# Patient Record
Sex: Female | Born: 1955
Health system: Southern US, Community
[De-identification: ages and names within clinical notes are randomized; demographics above are authoritative.]

## PROBLEM LIST (undated history)

## (undated) DIAGNOSIS — R002 Palpitations: Secondary | ICD-10-CM

## (undated) DIAGNOSIS — K5090 Crohn's disease, unspecified, without complications: Secondary | ICD-10-CM

## (undated) HISTORY — DX: Crohn's disease, unspecified, without complications: K50.90

## (undated) HISTORY — DX: Palpitations: R00.2

---

## 2005-05-21 ENCOUNTER — Ambulatory Visit: Payer: Self-pay | Admitting: Family Medicine

## 2005-06-11 ENCOUNTER — Ambulatory Visit: Payer: Self-pay | Admitting: Family Medicine

## 2005-07-09 ENCOUNTER — Ambulatory Visit: Payer: Self-pay | Admitting: Family Medicine

## 2005-07-23 ENCOUNTER — Ambulatory Visit: Payer: Self-pay | Admitting: Family Medicine

## 2005-11-28 ENCOUNTER — Ambulatory Visit: Payer: Self-pay | Admitting: Family Medicine

## 2006-01-29 DIAGNOSIS — I10 Essential (primary) hypertension: Secondary | ICD-10-CM | POA: Insufficient documentation

## 2006-01-29 DIAGNOSIS — E0590 Thyrotoxicosis, unspecified without thyrotoxic crisis or storm: Secondary | ICD-10-CM | POA: Insufficient documentation

## 2006-01-29 DIAGNOSIS — D509 Iron deficiency anemia, unspecified: Secondary | ICD-10-CM | POA: Insufficient documentation

## 2006-01-29 DIAGNOSIS — R002 Palpitations: Secondary | ICD-10-CM | POA: Insufficient documentation

## 2006-01-29 DIAGNOSIS — M479 Spondylosis, unspecified: Secondary | ICD-10-CM | POA: Insufficient documentation

## 2006-01-29 DIAGNOSIS — L2089 Other atopic dermatitis: Secondary | ICD-10-CM | POA: Insufficient documentation

## 2006-01-29 HISTORY — DX: Palpitations: R00.2

## 2006-03-25 ENCOUNTER — Encounter: Payer: Self-pay | Admitting: Family Medicine

## 2006-03-29 ENCOUNTER — Ambulatory Visit: Payer: Self-pay | Admitting: Family Medicine

## 2006-03-29 LAB — CONVERTED CEMR LAB: Sed Rate: 34 mm/hr — ABNORMAL HIGH (ref 0–22)

## 2006-04-01 ENCOUNTER — Telehealth (INDEPENDENT_AMBULATORY_CARE_PROVIDER_SITE_OTHER): Payer: Self-pay | Admitting: *Deleted

## 2006-04-01 ENCOUNTER — Telehealth: Payer: Self-pay | Admitting: Family Medicine

## 2006-04-02 ENCOUNTER — Telehealth (INDEPENDENT_AMBULATORY_CARE_PROVIDER_SITE_OTHER): Payer: Self-pay | Admitting: *Deleted

## 2006-04-02 ENCOUNTER — Telehealth: Payer: Self-pay | Admitting: Family Medicine

## 2006-04-30 ENCOUNTER — Telehealth: Payer: Self-pay | Admitting: Family Medicine

## 2006-04-30 ENCOUNTER — Telehealth (INDEPENDENT_AMBULATORY_CARE_PROVIDER_SITE_OTHER): Payer: Self-pay | Admitting: *Deleted

## 2006-05-02 ENCOUNTER — Ambulatory Visit: Payer: Self-pay | Admitting: Family Medicine

## 2006-06-04 ENCOUNTER — Encounter: Payer: Self-pay | Admitting: Family Medicine

## 2006-06-04 LAB — CONVERTED CEMR LAB: Sed Rate: 13 mm/hr (ref 0–22)

## 2006-06-07 ENCOUNTER — Ambulatory Visit: Payer: Self-pay | Admitting: Family Medicine

## 2006-06-07 DIAGNOSIS — N951 Menopausal and female climacteric states: Secondary | ICD-10-CM | POA: Insufficient documentation

## 2006-07-08 ENCOUNTER — Ambulatory Visit: Payer: Self-pay | Admitting: Family Medicine

## 2006-09-05 ENCOUNTER — Ambulatory Visit: Payer: Self-pay | Admitting: Family Medicine

## 2006-09-09 ENCOUNTER — Encounter: Payer: Self-pay | Admitting: Family Medicine

## 2006-12-02 ENCOUNTER — Ambulatory Visit: Payer: Self-pay | Admitting: Family Medicine

## 2006-12-09 ENCOUNTER — Telehealth: Payer: Self-pay | Admitting: Family Medicine

## 2006-12-09 DIAGNOSIS — M899 Disorder of bone, unspecified: Secondary | ICD-10-CM | POA: Insufficient documentation

## 2006-12-09 DIAGNOSIS — M949 Disorder of cartilage, unspecified: Secondary | ICD-10-CM

## 2006-12-12 ENCOUNTER — Encounter: Payer: Self-pay | Admitting: Family Medicine

## 2006-12-12 LAB — CONVERTED CEMR LAB
ALT: 15 units/L (ref 0–35)
Alkaline Phosphatase: 56 units/L (ref 39–117)
Cholesterol: 175 mg/dL (ref 0–200)
Creatinine, Ser: 0.98 mg/dL (ref 0.40–1.20)
LDL Cholesterol: 86 mg/dL (ref 0–99)
MCHC: 31.8 g/dL (ref 30.0–36.0)
MCV: 83 fL (ref 78.0–100.0)
Platelets: 275 10*3/uL (ref 150–400)
RDW: 13.2 % (ref 11.5–14.0)
Sed Rate: 15 mm/hr (ref 0–22)
Sodium: 142 meq/L (ref 135–145)
Total Bilirubin: 0.7 mg/dL (ref 0.3–1.2)
Total CHOL/HDL Ratio: 2.3
Total Protein: 6.4 g/dL (ref 6.0–8.3)
VLDL: 14 mg/dL (ref 0–40)
Vit D, 1,25-Dihydroxy: 29 (ref 20–57)

## 2006-12-13 ENCOUNTER — Telehealth (INDEPENDENT_AMBULATORY_CARE_PROVIDER_SITE_OTHER): Payer: Self-pay | Admitting: *Deleted

## 2006-12-13 ENCOUNTER — Encounter: Payer: Self-pay | Admitting: Family Medicine

## 2006-12-13 LAB — CONVERTED CEMR LAB: Free T4: 1.34 ng/dL (ref 0.89–1.80)

## 2006-12-16 ENCOUNTER — Telehealth: Payer: Self-pay | Admitting: Family Medicine

## 2006-12-16 ENCOUNTER — Encounter: Payer: Self-pay | Admitting: Family Medicine

## 2007-01-09 ENCOUNTER — Encounter: Payer: Self-pay | Admitting: Family Medicine

## 2007-01-28 ENCOUNTER — Encounter: Payer: Self-pay | Admitting: Family Medicine

## 2007-01-28 DIAGNOSIS — M459 Ankylosing spondylitis of unspecified sites in spine: Secondary | ICD-10-CM | POA: Insufficient documentation

## 2007-01-28 DIAGNOSIS — K5090 Crohn's disease, unspecified, without complications: Secondary | ICD-10-CM

## 2007-01-28 HISTORY — DX: Crohn's disease, unspecified, without complications: K50.90

## 2007-03-10 ENCOUNTER — Encounter: Payer: Self-pay | Admitting: Family Medicine

## 2007-03-17 ENCOUNTER — Ambulatory Visit: Payer: Self-pay | Admitting: Family Medicine

## 2007-03-17 DIAGNOSIS — M25469 Effusion, unspecified knee: Secondary | ICD-10-CM | POA: Insufficient documentation

## 2007-03-27 ENCOUNTER — Telehealth: Payer: Self-pay | Admitting: Family Medicine

## 2007-04-07 ENCOUNTER — Encounter: Payer: Self-pay | Admitting: Family Medicine

## 2007-04-07 LAB — CONVERTED CEMR LAB
ALT: 16 units/L
BUN: 16 mg/dL
CO2: 30 meq/L
Calcium: 10.5 mg/dL
GFR calc non Af Amer: >60 mL/min
Glucose, Bld: 97 mg/dL
Potassium: 3.8 meq/L
Sodium: 141 meq/L
Total Bilirubin: 1 mg/dL
Total Protein: 7.5 g/dL

## 2007-04-09 ENCOUNTER — Encounter: Payer: Self-pay | Admitting: Family Medicine

## 2007-04-23 ENCOUNTER — Encounter: Payer: Self-pay | Admitting: Family Medicine

## 2007-05-15 ENCOUNTER — Ambulatory Visit: Payer: Self-pay | Admitting: Family Medicine

## 2007-05-15 DIAGNOSIS — G4700 Insomnia, unspecified: Secondary | ICD-10-CM | POA: Insufficient documentation

## 2007-05-15 DIAGNOSIS — L608 Other nail disorders: Secondary | ICD-10-CM | POA: Insufficient documentation

## 2007-05-16 LAB — CONVERTED CEMR LAB
Free T4: 1.24 ng/dL (ref 0.89–1.80)
TSH: 0.229 microintl units/mL — ABNORMAL LOW (ref 0.350–5.50)

## 2007-05-28 ENCOUNTER — Encounter: Payer: Self-pay | Admitting: Family Medicine

## 2007-05-29 ENCOUNTER — Telehealth (INDEPENDENT_AMBULATORY_CARE_PROVIDER_SITE_OTHER): Payer: Self-pay | Admitting: *Deleted

## 2007-06-04 ENCOUNTER — Encounter: Payer: Self-pay | Admitting: Family Medicine

## 2007-06-05 ENCOUNTER — Encounter: Payer: Self-pay | Admitting: Family Medicine

## 2007-08-04 ENCOUNTER — Ambulatory Visit: Payer: Self-pay | Admitting: Family Medicine

## 2007-08-08 ENCOUNTER — Encounter: Payer: Self-pay | Admitting: Family Medicine

## 2007-10-02 ENCOUNTER — Ambulatory Visit: Payer: Self-pay | Admitting: Family Medicine

## 2007-10-02 DIAGNOSIS — K1321 Leukoplakia of oral mucosa, including tongue: Secondary | ICD-10-CM | POA: Insufficient documentation

## 2007-10-20 ENCOUNTER — Encounter: Payer: Self-pay | Admitting: Family Medicine

## 2007-11-20 ENCOUNTER — Ambulatory Visit: Payer: Self-pay | Admitting: Family Medicine

## 2008-01-23 ENCOUNTER — Encounter: Payer: Self-pay | Admitting: Family Medicine

## 2008-02-04 ENCOUNTER — Telehealth: Payer: Self-pay | Admitting: Family Medicine

## 2008-02-06 ENCOUNTER — Ambulatory Visit: Payer: Self-pay | Admitting: Family Medicine

## 2008-03-31 ENCOUNTER — Encounter: Payer: Self-pay | Admitting: Family Medicine

## 2008-04-22 ENCOUNTER — Encounter: Payer: Self-pay | Admitting: Family Medicine

## 2008-04-22 LAB — CONVERTED CEMR LAB
ALT: 25 units/L (ref 0–35)
AST: 31 units/L (ref 0–37)
Albumin: 4.3 g/dL (ref 3.5–5.2)
Alkaline Phosphatase: 60 units/L (ref 39–117)
LDL Cholesterol: 124 mg/dL — ABNORMAL HIGH (ref 0–99)
MCHC: 32 g/dL (ref 30.0–36.0)
MCV: 84.3 fL (ref 78.0–100.0)
Platelets: 217 10*3/uL (ref 150–400)
Potassium: 3.7 meq/L (ref 3.5–5.3)
Sodium: 142 meq/L (ref 135–145)
TSH: 0.536 microintl units/mL (ref 0.350–4.50)
Total Bilirubin: 0.7 mg/dL (ref 0.3–1.2)
Total Protein: 7 g/dL (ref 6.0–8.3)
VLDL: 10 mg/dL (ref 0–40)

## 2008-04-26 ENCOUNTER — Encounter: Payer: Self-pay | Admitting: Family Medicine

## 2008-07-02 ENCOUNTER — Encounter: Payer: Self-pay | Admitting: Family Medicine

## 2008-08-12 ENCOUNTER — Ambulatory Visit: Payer: Self-pay | Admitting: Family Medicine

## 2009-01-28 ENCOUNTER — Encounter: Payer: Self-pay | Admitting: Family Medicine

## 2009-02-21 ENCOUNTER — Ambulatory Visit: Payer: Self-pay | Admitting: Family Medicine

## 2009-04-21 ENCOUNTER — Encounter: Payer: Self-pay | Admitting: Family Medicine

## 2009-04-25 LAB — CONVERTED CEMR LAB
Albumin: 4.3 g/dL (ref 3.5–5.2)
Alkaline Phosphatase: 52 units/L (ref 39–117)
Calcium: 9.2 mg/dL (ref 8.4–10.5)
Chloride: 102 meq/L (ref 96–112)
Glucose, Bld: 79 mg/dL (ref 70–99)
Hemoglobin: 14.3 g/dL (ref 12.0–15.0)
LDL Cholesterol: 148 mg/dL — ABNORMAL HIGH (ref 0–99)
MCHC: 32.8 g/dL (ref 30.0–36.0)
Potassium: 3.6 meq/L (ref 3.5–5.3)
RBC: 5.08 M/uL (ref 3.87–5.11)
Sodium: 142 meq/L (ref 135–145)
Total Protein: 7.1 g/dL (ref 6.0–8.3)
Triglycerides: 52 mg/dL (ref <150)
WBC: 6.4 10*3/uL (ref 4.0–10.5)

## 2009-11-21 ENCOUNTER — Encounter: Payer: Self-pay | Admitting: Family Medicine

## 2010-03-09 ENCOUNTER — Encounter: Payer: Self-pay | Admitting: Family Medicine

## 2010-03-29 ENCOUNTER — Ambulatory Visit: Payer: Self-pay | Admitting: Family Medicine

## 2010-04-21 ENCOUNTER — Encounter: Payer: Self-pay | Admitting: Family Medicine

## 2010-04-25 ENCOUNTER — Encounter: Payer: Self-pay | Admitting: Family Medicine

## 2010-04-25 LAB — CONVERTED CEMR LAB
CO2: 26 meq/L (ref 19–32)
Calcium: 9.3 mg/dL (ref 8.4–10.5)
Creatinine, Ser: 1.01 mg/dL (ref 0.40–1.20)
Free T4: 1.12 ng/dL (ref 0.80–1.80)
HCT: 44 % (ref 36.0–46.0)
Iron: 75 ug/dL (ref 42–145)
MCHC: 31.6 g/dL (ref 30.0–36.0)
MCV: 88 fL (ref 78.0–100.0)
Platelets: 230 10*3/uL (ref 150–400)
RDW: 13.3 % (ref 11.5–15.5)
Sodium: 142 meq/L (ref 135–145)
T3, Free: 2.9 pg/mL (ref 2.3–4.2)
TSH: 0.267 microintl units/mL — ABNORMAL LOW (ref 0.350–4.500)
UIBC: 270 ug/dL
WBC: 6 10*3/uL (ref 4.0–10.5)

## 2010-05-23 NOTE — Miscellaneous (Signed)
Summary: mammogram normal  Clinical Lists Changes  Observations: Added new observation of MAMMRECACT: Screening mammogram in 1 year.    (11/17/2009 21:00) Added new observation of MAMMOGRAM: No specific mammographic evidence of malignancy.  No significant changes compared to previous study.  Assessment: BIRADS 1. Location: [facility].    (11/17/2009 21:00)      Mammogram  Procedure date:  11/17/2009  Findings:      No specific mammographic evidence of malignancy.  No significant changes compared to previous study.  Assessment: BIRADS 1. Location: [facility].     Comments:      Screening mammogram in 1 year.      Mammogram  Procedure date:  11/17/2009  Findings:      No specific mammographic evidence of malignancy.  No significant changes compared to previous study.  Assessment: BIRADS 1. Location: [facility].     Comments:      Screening mammogram in 1 year.

## 2010-05-23 NOTE — Assessment & Plan Note (Signed)
Summary: f/u BP   Vital Signs:  Patient profile:   55 year old female Height:      63.5 inches Weight:      147 pounds BMI:     25.72 O2 Sat:      100 % on Room air Pulse rate:   72 / minute BP sitting:   122 / 68  (left arm) Cuff size:   regular  Vitals Entered By: Payton Spark CMA (March 29, 2010 3:15 PM)  O2 Flow:  Room air CC: F/U meds. HCTZ refills, Hypertension Management   CC:  F/U meds. HCTZ refills and Hypertension Management.  Hypertension History:      She denies headache, chest pain, palpitations, dyspnea with exertion, orthopnea, peripheral edema, visual symptoms, neurologic problems, syncope, and side effects from treatment.  She notes no problems with any antihypertensive medication side effects.        Positive major cardiovascular risk factors include hypertension.  Negative major cardiovascular risk factors include female age less than 15 years old, no history of diabetes or hyperlipidemia, negative family history for ischemic heart disease, and non-tobacco-user status.        Further assessment for target organ damage reveals no history of ASHD, cardiac end-organ damage (CHF/LVH), stroke/TIA, peripheral vascular disease, renal insufficiency, or hypertensive retinopathy.     Current Medications (verified): 1)  Garlic 500 Mg  Tabs (Garlic) .... Take 1 Tablet By Mouth Once A Day 2)  Multivitamins  Caps (Multiple Vitamin) .... Once Daily By Mouth 3)  Prempro 0.45-1.5 Mg Tabs (Conj Estrog-Medroxyprogest Ace) .... Once Daily By Mouth 4)  Sulfasalazine 500 Mg  Tabs (Sulfasalazine) .... Take One Tab By Mouth Four Times Daily 5)  Caltrate 600+d 600-400 Mg-Unit  Tabs (Calcium Carbonate-Vitamin D) .... Take 1 Tablet By Mouth Two Times A Day 6)  Coq-10 30 Mg  Caps (Coenzyme Q10) .... Take 1 Tablet By Mouth Once A Day 7)  Fish Oil 1000 Mg  Caps (Omega-3 Fatty Acids) .... Take 1 Tablet By Mouth Three Times A Day 8)  Ra Glucosamine-Chondroit-Msm 500-400-300 Mg  Tabs  (Glucosamine-Chondroitin-Msm) .... Take 1 Tablet By Mouth Two Times A Day 9)  Magnesium Citrate 160mg  .... Take Two By Mouth Daily 10)  Iron 28mg  .... Take 1 Tablet By Mouth Once A Day 11)  C-500 500 Mg  Chew (Ascorbic Acid) .... Take 1 Tablet By Mouth Two Times A Day 12)  Wobenzyme Enzyme Supplement .... Take 3 By Mouth Two Times A Day 13)  Evening Primrose 500mg  .... Take Two By Mouth Three Times Daily 14)  Hydrochlorothiazide 25 Mg Tabs (Hydrochlorothiazide) .Marland Kitchen.. 1 Tab By Mouth Daily 15)  Humira 20 Mg/0.25ml Kit (Adalimumab) .... Use As Directed  Allergies (verified): No Known Drug Allergies  Past History:  Past Medical History: Reviewed history from 05/15/2007 and no changes required. ? MVP eczema  Retinal inflammation subclinical hyperthryoidism  anemia, microcytic - check iron studies spondyloarthropathy - Dr Christen Bame colonoscopy 9-08 suggestive of crohns dz (chronic active ileitis, focal chronic active colitis, focal chronic active proctitis)  seeing [facility]  Past Surgical History: Reviewed history from 01/29/2006 and no changes required. cspine xray L4-L5 Degen changes  Social History: Reviewed history from 08/12/2008 and no changes required. Airline pilot for [employer].  Married to [place] and has 3 kids, 22, 19, and 16yo.  Quit smoking in 2003.  Denies ETOH.  Does not exercise.  teaching at [employer]  Review of Systems      See  HPI  Physical Exam  General:  alert, well-developed, well-nourished, and well-hydrated.   Head:  normocephalic and atraumatic.   Eyes:  pupils equal, pupils round, and pupils reactive to light.   Mouth:  good dentition and pharynx pink and moist.   Neck:  supple and no masses.   Lungs:  Normal respiratory effort, chest expands symmetrically. Lungs are clear to auscultation, no crackles or wheezes. Heart:  Normal rate and regular rhythm. S1 and S2 normal without gallop, murmur, click, rub or other extra sounds. Pulses:  2+  radial pulses Extremities:  no LE edema Skin:  color normal.   Cervical Nodes:  No lymphadenopathy noted Psych:  good eye contact, not anxious appearing, and not depressed appearing.     Impression & Recommendations:  Problem # 1:  HYPERTENSION, BENIGN SYSTEMIC (ICD-401.1) BP looks great.  Continue HCTZ daily and update fasting labs in Jan.  Meds RFd. Her updated medication list for this problem includes:    Hydrochlorothiazide 25 Mg Tabs (Hydrochlorothiazide) .Marland Kitchen... 1 tab by mouth daily  Orders: T-Basic Metabolic Panel [id number])  BP today: 122/68 Prior BP: 122/68 (02/21/2009)  Prior 10 Yr Risk Heart Disease: 5 % (02/21/2009)  Labs Reviewed: K+: 3.6 (04/21/2009) Creat: : 0.97 (04/21/2009)   Chol: 255 (04/21/2009)   HDL: 97 (04/21/2009)   LDL: 148 (04/21/2009)   TG: 52 (04/21/2009)  Problem # 2:  SPONDYLOARTHROPATHY (ICD-721.90) Followed by Dr Christen Bame.  On Humira and doing well. Flu shot updated today. Labs ordered for Jan.  Complete Medication List: 1)  Garlic 500 Mg Tabs (Garlic) .... Take 1 tablet by mouth once a day 2)  Multivitamins Caps (Multiple vitamin) .... Once daily by mouth 3)  Prempro 0.45-1.5 Mg Tabs (Conj estrog-medroxyprogest ace) .... Once daily by mouth 4)  Sulfasalazine 500 Mg Tabs (Sulfasalazine) .... Take one tab by mouth four times daily 5)  Caltrate 600+d 600-400 Mg-unit Tabs (Calcium carbonate-vitamin d) .... Take 1 tablet by mouth two times a day 6)  Coq-10 30 Mg Caps (Coenzyme q10) .... Take 1 tablet by mouth once a day 7)  Fish Oil 1000 Mg Caps (Omega-3 fatty acids) .... Take 1 tablet by mouth three times a day 8)  Ra Glucosamine-chondroit-msm 500-400-300 Mg Tabs (Glucosamine-chondroitin-msm) .... Take 1 tablet by mouth two times a day 9)  Magnesium Citrate 160mg   .... Take two by mouth daily 10)  Iron 28mg   .... Take 1 tablet by mouth once a day 11)  C-500 500 Mg Chew (Ascorbic acid) .... Take 1 tablet by mouth two times a day 12)  Wobenzyme  Enzyme Supplement  .... Take 3 by mouth two times a day 13)  Evening Primrose 500mg   .... Take two by mouth three times daily 14)  Hydrochlorothiazide 25 Mg Tabs (Hydrochlorothiazide) .Marland Kitchen.. 1 tab by mouth daily 15)  Humira 20 Mg/0.69ml Kit (Adalimumab) .... Use as directed  Other Orders: T-CBC No Diff ([id number]) T-Vitamin D (25-Hydroxy) [phone]) T-Iron [id number]) T-Iron Binding Capacity (TIBC) ([address]) T-TSH [phone]) Flu Vaccine 88yrs + (40347) Admin 1st Vaccine (42595)  Hypertension Assessment/Plan:      The patient's hypertensive risk group is category A: No risk factors and no target organ damage.  Her calculated 10 year risk of coronary heart disease is 5 %.  Today's blood pressure is 122/68.  Her blood pressure goal is < 140/90.  Patient Instructions: 1)  Flu shot today. 2)  Update fasting labs downstairs in [place]. 3)  Will call you w/ results. 4)  HCTZ refilled for  a year thru [facility]. 5)  Call if any problems. 6)  Return for f/u in 1 yr, sooner if needed for anything! Prescriptions: HYDROCHLOROTHIAZIDE 25 MG TABS (HYDROCHLOROTHIAZIDE) 1 tab by mouth daily  #90 x 3   Entered and Authorized by:   Seymour Bars DO   Signed by:   Seymour Bars DO on 03/29/2010   Method used:   Electronically to        [facility]* (retail)             ,          Ph: [phone]       Fax: [phone]   RxID:   [id number]    Orders Added: 1)  T-CBC No Diff [[id number]] 2)  T-Basic Metabolic Panel [60737-10626] 3)  T-Vitamin D (25-Hydroxy) [94854-62703] 4)  T-Iron [[id number]] 5)  T-Iron Binding Capacity (TIBC) [[address]] 6)  T-TSH [93810-17510] 7)  Est. Patient Level III [25852] 8)  Flu Vaccine 10yrs + [77824] 9)  Admin 1st Vaccine [23536]   Immunizations Administered:  Influenza Vaccine # 1:    Vaccine Type: Fluvax 3+    Site: left deltoid    Mfr: GlaxoSmithKline    Dose: 0.5 ml    Route: IM    Given by: Sue Lush McCrimmon CMA, (AAMA)     Exp. Date: 09/22/2010    Lot #: [id number]    VIS given: 11/15/09 version given March 29, 2010.  Flu Vaccine Consent Questions:    Do you have a history of severe allergic reactions to this vaccine? no    Any prior history of allergic reactions to egg and/or gelatin? no    Do you have a sensitivity to the preservative Thimersol? no    Do you have a past history of Guillan-Barre Syndrome? no    Do you currently have an acute febrile illness? no    Have you ever had a severe reaction to latex? no    Vaccine information given and explained to patient? no    Are you currently pregnant? no   Immunizations Administered:  Influenza Vaccine # 1:    Vaccine Type: Fluvax 3+    Site: left deltoid    Mfr: GlaxoSmithKline    Dose: 0.5 ml    Route: IM    Given by: Sue Lush McCrimmon CMA, (AAMA)    Exp. Date: 09/22/2010    Lot #: [id number]    VIS given: 11/15/09 version given March 29, 2010.

## 2010-07-04 ENCOUNTER — Encounter: Payer: Self-pay | Admitting: Emergency Medicine

## 2010-07-04 ENCOUNTER — Ambulatory Visit (INDEPENDENT_AMBULATORY_CARE_PROVIDER_SITE_OTHER): Payer: BC Managed Care – PPO | Admitting: Emergency Medicine

## 2010-07-04 DIAGNOSIS — J069 Acute upper respiratory infection, unspecified: Secondary | ICD-10-CM

## 2010-07-05 ENCOUNTER — Encounter: Payer: Self-pay | Admitting: Emergency Medicine

## 2010-07-07 ENCOUNTER — Telehealth (INDEPENDENT_AMBULATORY_CARE_PROVIDER_SITE_OTHER): Payer: Self-pay | Admitting: *Deleted

## 2010-07-11 NOTE — Progress Notes (Signed)
  Phone Note Outgoing Call   Call placed by: Clemens Catholic LPN,  July 07, 2010 11:13 AM Call placed to: Patient Summary of Call: call back: pt states that she is feeling better. advised her of her throat culture results and to complete ABT. call back if she has any questions or concerns. Initial call taken by: Clemens Catholic LPN,  July 07, 2010 11:14 AM

## 2010-07-11 NOTE — Assessment & Plan Note (Signed)
Summary: EARACHE, SORE THROAT/TJ rm 5   Vital Signs:  Patient Profile:   55 Years Old Female CC:      ear ache, sore throat Height:     63.5 inches Weight:      148.25 pounds O2 Sat:      100 % O2 treatment:    Room Air Temp:     98.8 degrees F oral Pulse rate:   72 / minute Resp:     16 per minute BP sitting:   123 / 71  (left arm) Cuff size:   regular  Vitals Entered By: Clemens Catholic LPN (July 04, 2010 4:31 PM)                  Updated Prior Medication List: GARLIC 500 MG  TABS (GARLIC) Take 1 tablet by mouth once a day MULTIVITAMINS  CAPS (MULTIPLE VITAMIN) once daily by mouth PREMPRO 0.45-1.5 MG TABS (CONJ ESTROG-MEDROXYPROGEST ACE) once daily by mouth SULFASALAZINE 500 MG  TABS (SULFASALAZINE) take one tab by mouth four times daily CALTRATE 600+D 600-400 MG-UNIT  TABS (CALCIUM CARBONATE-VITAMIN D) Take 1 tablet by mouth two times a day COQ-10 30 MG  CAPS (COENZYME Q10) Take 1 tablet by mouth once a day FISH OIL 1000 MG  CAPS (OMEGA-3 FATTY ACIDS) Take 1 tablet by mouth three times a day RA GLUCOSAMINE-CHONDROIT-MSM 500-400-300 MG  TABS (GLUCOSAMINE-CHONDROITIN-MSM) Take 1 tablet by mouth two times a day * MAGNESIUM CITRATE 160MG  take two by mouth daily * IRON 28MG  Take 1 tablet by mouth once a day C-500 500 MG  CHEW (ASCORBIC ACID) Take 1 tablet by mouth two times a day * WOBENZYME ENZYME SUPPLEMENT take 3 by mouth two times a day * EVENING PRIMROSE 500MG  take two by mouth three times daily HYDROCHLOROTHIAZIDE 25 MG TABS (HYDROCHLOROTHIAZIDE) 1 tab by mouth daily HUMIRA 20 MG/0.4ML KIT (ADALIMUMAB) USE AS DIRECTED  Current Allergies (reviewed today): No known allergies History of Present Illness History from: patient Chief Complaint: ear ache, sore throat History of Present Illness: 55 Years Old Female complains of onset of cold symptoms for 4 days.  Joan Yang has been using Alavert which is helping a little bit. + sore throat + cough No pleuritic pain No  wheezing No nasal congestion No post-nasal drainage + sinus pain/pressure No chest congestion No itchy/red eyes + B earache No hemoptysis No SOB + chills/sweats No fever No nausea No vomiting No abdominal pain No diarrhea No skin rashes No fatigue No myalgias No headache   REVIEW OF SYSTEMS Constitutional Symptoms      Denies fever, chills, night sweats, weight loss, weight gain, and fatigue.  Eyes       Complains of glasses.      Denies change in vision, eye pain, eye discharge, contact lenses, and eye surgery. Ear/Nose/Throat/Mouth       Complains of ear pain, sore throat, and hoarseness.      Denies hearing loss/aids, change in hearing, ear discharge, dizziness, frequent runny nose, frequent nose bleeds, sinus problems, and tooth pain or bleeding.  Respiratory       Complains of dry cough.      Denies productive cough, wheezing, shortness of breath, asthma, bronchitis, and emphysema/COPD.  Cardiovascular       Denies murmurs, chest pain, and tires easily with exhertion.    Gastrointestinal       Denies stomach pain, nausea/vomiting, diarrhea, constipation, blood in bowel movements, and indigestion. Genitourniary       Denies painful urination,  kidney stones, and loss of urinary control. Neurological       Denies paralysis, seizures, and fainting/blackouts. Musculoskeletal       Denies muscle pain, joint pain, joint stiffness, decreased range of motion, redness, swelling, muscle weakness, and gout.  Skin       Denies bruising, unusual mles/lumps or sores, and hair/skin or nail changes.  Psych       Denies mood changes, temper/anger issues, anxiety/stress, speech problems, depression, and sleep problems. Other Comments: pt c/o bilateral ear ache, productive cough and sore throat x 1wk. no fever. she has taken OTC Alavert with no relief.   Past History:  Past Medical History: Reviewed history from 05/15/2007 and no changes required. ? MVP eczema  Retinal  inflammation subclinical hyperthryoidism  anemia, microcytic - check iron studies spondyloarthropathy - Dr Christen Bame colonoscopy 9-08 suggestive of crohns dz (chronic active ileitis, focal chronic active colitis, focal chronic active proctitis)  seeing [facility]  Past Surgical History: Reviewed history from 01/29/2006 and no changes required. cspine xray L4-L5 Degen changes  Family History: Reviewed history from 03/25/2006 and no changes required. 2 brothers healthy  father alive, HTN, OA Mother died of ESRD, HTN, RA  Social History: Reviewed history from 08/12/2008 and no changes required. 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  Quit smoking in 2003.  Denies ETOH.  Does not exercise.  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 Exam General appearance: well developed, well nourished, no acute distress Ears: L TM erythema, bilateral eustachian tube dysfx Nasal: mucosa pink, nonedematous, no septal deviation, turbinates normal Oral/Pharynx: clear drainage, mild erythema, tonsils mildly enlarged bilaterally, no exudates Chest/Lungs: no rales, wheezes, or rhonchi bilateral, breath sounds equal without effort Heart: regular rate and  rhythm, no murmur MSE: oriented to time, place, and person Assessment New Problems: UPPER RESPIRATORY INFECTION, ACUTE (ICD-465.9)   Plan New Medications/Changes: AMOXICILLIN 875 MG TABS (AMOXICILLIN) 1 by mouth two times a day for 10 days  #20 x 0, 07/04/2010, Hoyt Koch MD  New Orders: New Patient Level III [phone] Rapid Strep [87880] T-Culture, Throat [[id number]] Planning Comments:   1)  Take the prescribed antibiotic as instructed. 2)  Use nasal saline solution (over the counter) at least 3 times a day. 3)  Use over the counter decongestants like Zyrtec-D every 12 hours as needed to help with congestion.  Monitor BP.  Stop if BP >140/90 4)  Can take tylenol every 6 hours or motrin every 8  hours for pain or fever. 5)  Follow up with your primary doctor  if no improvement in 5-7 days, sooner if increasing pain, fever, or new symptoms.    The patient and/or caregiver has been counseled thoroughly with regard to medications prescribed including dosage, schedule, interactions, rationale for use, and possible side effects and they verbalize understanding.  Diagnoses and expected course of recovery discussed and will return if not improved as expected or if the condition worsens. Patient and/or caregiver verbalized understanding.  Prescriptions: AMOXICILLIN 875 MG TABS (AMOXICILLIN) 1 by mouth two times a day for 10 days  #20 x 0   Entered and Authorized by:   Hoyt Koch MD   Signed by:   Hoyt Koch MD on 07/04/2010   Method used:   Print then Give to Patient   RxID:   [id number]   Orders Added: 1)  New Patient Level III [29528] 2)  Rapid Strep [41324] 3)  T-Culture, Throat [[id number]]  Laboratory Results  Date/Time Received: July 04, 2010 4:46 PM  Date/Time Reported: July 04, 2010 4:46 PM   Other Tests  Rapid Strep: negative  Kit Test Internal QC: Negative   (Normal Range: Negative)

## 2010-10-19 ENCOUNTER — Telehealth: Payer: Self-pay | Admitting: Family Medicine

## 2010-10-19 NOTE — Telephone Encounter (Signed)
Pt calling and said she had seen her rheumatologist and that provider was getting labs and pt wants [place] to give her an order for the labs we need drawn from our office so she can combine the lab visit and do all at one time. Plan:  [facility] for the pt to call on [place] to let triage nurse what labs she feels she is suppose to have drawn from our office. Jarvis Newcomer, LPN [facility]

## 2010-10-20 NOTE — Telephone Encounter (Signed)
Request was taken care of and a message left for the patient that our orders had been sent down to [facility]. Joan Newcomer, LPN [facility]

## 2011-03-20 ENCOUNTER — Encounter: Payer: Self-pay | Admitting: Family Medicine

## 2011-03-20 ENCOUNTER — Ambulatory Visit (INDEPENDENT_AMBULATORY_CARE_PROVIDER_SITE_OTHER): Payer: BC Managed Care – PPO | Admitting: Family Medicine

## 2011-03-20 VITALS — BP 128/72 | HR 81 | Ht 64.0 in | Wt 150.0 lb

## 2011-03-20 DIAGNOSIS — I10 Essential (primary) hypertension: Secondary | ICD-10-CM

## 2011-03-20 DIAGNOSIS — M949 Disorder of cartilage, unspecified: Secondary | ICD-10-CM

## 2011-03-20 DIAGNOSIS — Z23 Encounter for immunization: Secondary | ICD-10-CM

## 2011-03-20 DIAGNOSIS — E0590 Thyrotoxicosis, unspecified without thyrotoxic crisis or storm: Secondary | ICD-10-CM

## 2011-03-20 DIAGNOSIS — Z0000 Encounter for general adult medical examination without abnormal findings: Secondary | ICD-10-CM

## 2011-03-20 DIAGNOSIS — M459 Ankylosing spondylitis of unspecified sites in spine: Secondary | ICD-10-CM

## 2011-03-20 DIAGNOSIS — M899 Disorder of bone, unspecified: Secondary | ICD-10-CM

## 2011-03-20 DIAGNOSIS — M8580 Other specified disorders of bone density and structure, unspecified site: Secondary | ICD-10-CM

## 2011-03-20 MED ORDER — HYDROCHLOROTHIAZIDE 25 MG PO TABS: 25.0000 mg | ORAL_TABLET | Freq: Every day | ORAL | Status: DC

## 2011-03-20 NOTE — Patient Instructions (Signed)

## 2011-03-20 NOTE — Progress Notes (Signed)
  Subjective:    Patient ID: Joan Yang, female    DOB: [birth year], 55 y.o.   MRN: [id number] #1 Hypertension  #2 ankylosis spondylolisthesis patient has been seeing a rheumatologist for this diagnosis. Apparently this has been cause of multiple medical problems as she's had some we discussed the need to go through her problem list ankle make some corrections. #3 need for immunization health maintenance review several issues it appeared as far as needed for 4 health maintenance i.e. immunization lab work and will discuss and review her records to evaluate what needs to be done. #4 Borderline hypothyroidism no new labs been done as far asthat problem #5 history of osteopenias she requests a bone densitometry    Review of Systems  All other systems reviewed and are negative.   medical records were reviewed and problem list scrutinized and modify. Also vaccinations and health maintenance issues were discussed patient to make sure they were all up to date also discussed was the need for certain labs in the very near future. Flu vaccination was also discussed    BP 128/72  Pulse 81  Ht 5\' 4"  (1.626 m)  Wt 150 lb (68.04 kg)  BMI 25.75 kg/m2  SpO2 100% Objective:   Physical Exam  Constitutional: She is oriented to person, place, and time. She appears well-developed and well-nourished.  HENT:  Head: Normocephalic and atraumatic.  Eyes: Pupils are equal, round, and reactive to light.  Neck: Normal range of motion. Neck supple.  Cardiovascular: Normal rate, regular rhythm and normal heart sounds.   Pulmonary/Chest: Effort normal and breath sounds normal.  Musculoskeletal: Normal range of motion.  Neurological: She is alert and oriented to person, place, and time.  Skin: Skin is warm and dry.  Psychiatric: She has a normal mood and affect. Her behavior is normal.     assessment and plans   #1 hypertension we'll we'll continue to treat with HCTZ 25 mg #2 5#ankylosis spondylitis he says  renal panel will be checked with the CPAP as well as ALT and AST and give patient opportunity to share with her rheumatologist. #3 flu shot will be given resident physician is. The current also most of the maintenance seizures have also been done we'll check SMAC or CMP and CBC #4 hypothyroidism we'll check a TSH level #5 osteopenia we will check a bone densitometry and a T3 level      Assesment and plans #1 hypertension hypertension hypertension hypertension high per tension hypertension will continue with the HCTZ 25 mg hypertension we'll continue to treat with HCTZ 25 mg hypertension   #1

## 2011-04-20 LAB — COMPLETE METABOLIC PANEL WITH GFR
ALT: 19 U/L (ref 0–35)
AST: 33 U/L (ref 0–37)
CO2: 28 mEq/L (ref 19–32)
Chloride: 103 mEq/L (ref 96–112)
Creat: 0.94 mg/dL (ref 0.50–1.10)
GFR, Est African American: 79 mL/min
Sodium: 141 mEq/L (ref 135–145)
Total Bilirubin: 0.6 mg/dL (ref 0.3–1.2)
Total Protein: 6.9 g/dL (ref 6.0–8.3)

## 2011-04-20 LAB — LIPID PANEL
Cholesterol: 243 mg/dL — ABNORMAL HIGH (ref 0–200)
LDL Cholesterol: 141 mg/dL — ABNORMAL HIGH (ref 0–99)
Total CHOL/HDL Ratio: 2.6 Ratio
Triglycerides: 40 mg/dL (ref <150)

## 2011-04-20 LAB — HEMOGLOBIN A1C: Mean Plasma Glucose: 108 mg/dL (ref <117)

## 2011-04-20 LAB — TSH: TSH: 0.156 u[IU]/mL — ABNORMAL LOW (ref 0.350–4.500)

## 2011-05-01 ENCOUNTER — Other Ambulatory Visit: Payer: BC Managed Care – PPO

## 2011-05-18 ENCOUNTER — Encounter: Payer: Self-pay | Admitting: Physician Assistant

## 2011-05-18 ENCOUNTER — Ambulatory Visit (INDEPENDENT_AMBULATORY_CARE_PROVIDER_SITE_OTHER): Payer: BC Managed Care – PPO | Admitting: Physician Assistant

## 2011-05-18 VITALS — BP 128/73 | HR 65 | Temp 98.3°F | Ht 65.0 in | Wt 147.0 lb

## 2011-05-18 DIAGNOSIS — I10 Essential (primary) hypertension: Secondary | ICD-10-CM

## 2011-05-18 DIAGNOSIS — R079 Chest pain, unspecified: Secondary | ICD-10-CM

## 2011-05-18 LAB — CK TOTAL AND CKMB (NOT AT ARMC)
CK, MB: 2.1 ng/mL (ref 0.3–4.0)
Relative Index: 1 (ref 0.0–2.5)

## 2011-05-18 LAB — TROPONIN I: Troponin I: <0.04 ng/mL (ref <0.06)

## 2011-05-18 MED ORDER — HYDROCHLOROTHIAZIDE 25 MG PO TABS: 25.0000 mg | ORAL_TABLET | Freq: Every day | ORAL | Status: DC

## 2011-05-18 NOTE — Progress Notes (Signed)
  Subjective:    Patient ID: Joan Yang, female    DOB: [birth year], 56 y.o.   MRN: [id number]  HPI Patient presents to the clinic this morning complaining of chest pain and belching. She's been on a [employer] fast for 21 days. She's not been eating meat at all and nothing to keep him she states and Thursday. She started having chest pain about a week ago. The chest pain is at rest only and not with exertion. She has experienced this pain 3 times since Friday. She continues to workout without chest pain. The pain has radiated into her left shoulder and down into her abdomen. She has had a history of indigestion and abdominal pain but she reports that she has never had chest pain. She has had a history of anemia but no longer takes iron. She has been belching a lot and noticed more flatulence. She denies bowel changes with diarrhea or constipation. Her heart does feel like a fluttering sometimes this is an ongoing problem.  She needs refills on her HCTZ. She denies any problems with her blood pressure.   Review of Systems     Objective:   Physical Exam  Constitutional: She is oriented to person, place, and time. She appears well-developed and well-nourished.  HENT:  Head: Normocephalic and atraumatic.  Right Ear: External ear normal.  Left Ear: External ear normal.  Cardiovascular: Regular rhythm, normal heart sounds and intact distal pulses.        Sinus Bradycardia at 65 on ECG.  Pulmonary/Chest: Effort normal and breath sounds normal.  Abdominal: Soft. Bowel sounds are normal. She exhibits no distension. There is no tenderness.  Neurological: She is alert and oriented to person, place, and time.  Skin: Skin is warm and dry.  Psychiatric: She has a normal mood and affect. Her behavior is normal.          Assessment & Plan:  Chest pain at rest-Hemoglobin 13.8. EKG-sinus bradycardia within normal. No ST segment changes. Labs drawn for troponin 1 and CK-MB. We'll call with results. Start  on omeprazole 20 mg daily.  Hypertension-refilled HCTZ.

## 2011-05-18 NOTE — Patient Instructions (Signed)
Omeprazole 20mg  daily. Get lab work. We will call with results.

## 2011-05-21 ENCOUNTER — Encounter: Payer: Self-pay | Admitting: *Deleted

## 2011-07-02 ENCOUNTER — Encounter: Payer: Self-pay | Admitting: Family Medicine

## 2011-07-02 ENCOUNTER — Ambulatory Visit (INDEPENDENT_AMBULATORY_CARE_PROVIDER_SITE_OTHER): Payer: BC Managed Care – PPO | Admitting: Family Medicine

## 2011-07-02 VITALS — BP 97/59 | HR 85 | Temp 98.3°F | Wt 146.0 lb

## 2011-07-02 DIAGNOSIS — J069 Acute upper respiratory infection, unspecified: Secondary | ICD-10-CM

## 2011-07-02 MED ORDER — MAGIC MOUTHWASH: 5.0000 mL | Freq: Three times a day (TID) | ORAL | Status: DC

## 2011-07-02 MED ORDER — AZITHROMYCIN 250 MG PO TABS: ORAL_TABLET | ORAL | Status: AC

## 2011-07-02 NOTE — Patient Instructions (Signed)

## 2011-07-02 NOTE — Progress Notes (Signed)
  Subjective:    Patient ID: Joan Yang, female    DOB: [birth year], 56 y.o.   MRN: [id number]  HPI 5 days of ST, otalgia bilat and wet cough. No fever.  Ears feel clogged. Taking  IBU, sudafed - helps some. No facial pressure. No ear dainage.  No SOB.  No hx of asthma. + sick contacts. Says she feels tired and sore all over. She had had dec appetite. SHe is also on humira. Says took her shot this AM. Trtied to hold off but her back was hurting from her sponyloarthropathy.    Review of Systems     Objective:   Physical Exam  Constitutional: She is oriented to person, place, and time. She appears well-developed and well-nourished.  HENT:  Head: Normocephalic and atraumatic.  Right Ear: External ear normal.  Left Ear: External ear normal.  Nose: Nose normal.  Mouth/Throat: Oropharynx is clear and moist.       TMs and canals are clear. She is a brown pigmentation of her tongue.  Eyes: Conjunctivae and EOM are normal. Pupils are equal, round, and reactive to light.  Neck: Neck supple. No thyromegaly present.  Cardiovascular: Normal rate, regular rhythm and normal heart sounds.   Pulmonary/Chest: Effort normal and breath sounds normal. She has no wheezes.  Lymphadenopathy:    She has no cervical adenopathy.  Neurological: She is alert and oriented to person, place, and time.  Skin: Skin is warm and dry.       Small mild ulcers on the sides of her tongue.   Psychiatric: She has a normal mood and affect.          Assessment & Plan:  URI - we discussed that she likely has a viral cold. She's only had symptoms for a few days. Does she is on Humira which is an immunosuppressant. I will plan: Prescription for azithromycin to the pharmacy. As such I get a couple more days 2 she starts to feel better. If not she can go ahead and fill it. If her ear pain persists in place followup. Her ear exam is actually normal today even though that is her main complaint. Continue symptomatic treatment  since it does give her some relief with the ibuprofen and Sudafed. I also wrote for Magic mouthwash for the ulcers on the side of her tongue.  Mouth ulcers.-  I also wrote for Magic mouthwash for the ulcers on the side of her tongue.

## 2011-07-03 ENCOUNTER — Telehealth: Payer: Self-pay | Admitting: *Deleted

## 2011-07-03 NOTE — Telephone Encounter (Signed)
Pt.notified

## 2011-07-03 NOTE — Telephone Encounter (Signed)
I will give you a copy of recipe to call into pharm.

## 2011-07-03 NOTE — Telephone Encounter (Signed)
Pt called and states pharm needs exact quantity and the instrucition and exactly what is in the magic mouth wash rx'ed. They have not filled it yet. Pharm also called and states the same thing

## 2011-11-05 ENCOUNTER — Other Ambulatory Visit: Payer: Self-pay | Admitting: Family Medicine

## 2011-11-05 DIAGNOSIS — Z1231 Encounter for screening mammogram for malignant neoplasm of breast: Secondary | ICD-10-CM

## 2011-12-11 ENCOUNTER — Ambulatory Visit (INDEPENDENT_AMBULATORY_CARE_PROVIDER_SITE_OTHER): Payer: BC Managed Care – PPO

## 2011-12-11 DIAGNOSIS — E0590 Thyrotoxicosis, unspecified without thyrotoxic crisis or storm: Secondary | ICD-10-CM

## 2011-12-11 DIAGNOSIS — M8580 Other specified disorders of bone density and structure, unspecified site: Secondary | ICD-10-CM

## 2011-12-11 DIAGNOSIS — Z1231 Encounter for screening mammogram for malignant neoplasm of breast: Secondary | ICD-10-CM

## 2011-12-11 DIAGNOSIS — Z13820 Encounter for screening for osteoporosis: Secondary | ICD-10-CM

## 2011-12-11 DIAGNOSIS — Z780 Asymptomatic menopausal state: Secondary | ICD-10-CM

## 2011-12-21 ENCOUNTER — Ambulatory Visit (INDEPENDENT_AMBULATORY_CARE_PROVIDER_SITE_OTHER): Payer: BC Managed Care – PPO | Admitting: Family Medicine

## 2011-12-21 ENCOUNTER — Encounter: Payer: Self-pay | Admitting: Family Medicine

## 2011-12-21 VITALS — BP 129/65 | HR 73 | Wt 158.0 lb

## 2011-12-21 DIAGNOSIS — R51 Headache: Secondary | ICD-10-CM

## 2011-12-21 DIAGNOSIS — R635 Abnormal weight gain: Secondary | ICD-10-CM

## 2011-12-21 DIAGNOSIS — R5381 Other malaise: Secondary | ICD-10-CM

## 2011-12-21 DIAGNOSIS — R5383 Other fatigue: Secondary | ICD-10-CM

## 2011-12-21 NOTE — Progress Notes (Signed)
Subjective:    Patient ID: Joan Yang, female    DOB: 1955-11-22, 56 y.o.   MRN: [id number]  HPI Has gained 15 lb in 6 months. Has been working out regularly.  Feels tired all the time.  Says she eats very healthy. She is on humira for her ankylosis spondylitis.  More requent HA thtn usual over the last 2-3 months.  No skin or hair changes.  Hx of eczema. She denies any pain or swelling in her neck. She says years ago she was actually hypothyroid was on medication. At one point in time she became hyperthyroid and I recommended surgical removal of her thyroid. She never had surgery and since then they decided to monitor her levels. She's always been a little bit borderline hyperthyroid. She does get frequent CBCs with your Humira. No prior history of anemia or B12 deficiency.   Review of Systems \ BP 129/65  Pulse 73  Wt 158 lb (71.668 kg)    No Known Allergies  No past medical history on file.  No past surgical history on file.  History   Social History  . Marital Status: Married    Spouse Name: N/A    Number of Children: N/A  . Years of Education: N/A   Occupational History  . Not on file.   Social History Main Topics  . Smoking status: Former Games developer  . Smokeless tobacco: Not on file  . Alcohol Use: Not on file  . Drug Use: Not on file  . Sexually Active: Not on file   Other Topics Concern  . Not on file   Social History Narrative  . No narrative on file    No family history on file.  Outpatient Encounter Prescriptions as of 12/21/2011  Medication Sig Dispense Refill  . adalimumab (HUMIRA PEN) 40 MG/0.8ML injection Inject 40 mg into the skin every 14 (fourteen) days.        . Alum & Mag Hydroxide-Simeth (MAGIC MOUTHWASH) SOLN Take 5 mLs by mouth 3 (three) times daily.  150 mL  0  . estrogen, conjugated,-medroxyprogesterone (PREMPRO) 0.45-1.5 MG per tablet Take 1 tablet by mouth daily.        . hydrochlorothiazide (HYDRODIURIL) 25 MG tablet Take 1 tablet (25 mg  total) by mouth daily.  90 tablet  3          Objective:   Physical Exam  Constitutional: She is oriented to person, place, and time. She appears well-developed and well-nourished.  HENT:  Head: Normocephalic and atraumatic.  Neck: Neck supple. No thyromegaly present.  Cardiovascular: Normal rate, regular rhythm and normal heart sounds.   Pulmonary/Chest: Effort normal and breath sounds normal.  Lymphadenopathy:    She has no cervical adenopathy.  Neurological: She is alert and oriented to person, place, and time.  Skin: Skin is warm and dry.  Psychiatric: She has a normal mood and affect. Her behavior is normal.          Assessment & Plan:  Abnormal weight gain- discussed that we will check her thyroid levels along with a free T4 and free T3. We'll also check for B12 deficiency and anemia since she also complains of fatigue today. We'll repeat a CBC as well as check electrolytes and also check renal function and liver function. She seems to be eating healthy overall and works out very regularly so I do agree that her weight gain seems to be pretty substantial pressure. Time for someone who has healthy lifestyle.  Fatigue-please see  above.

## 2011-12-21 NOTE — Patient Instructions (Addendum)
We will call you with your lab results. If you don't here from us in about a week then please give us a call at [phone].  

## 2011-12-22 LAB — CBC WITH DIFFERENTIAL/PLATELET
Eosinophils Absolute: 0.2 10*3/uL (ref 0.0–0.7)
Eosinophils Relative: 2 % (ref 0–5)
HCT: 41.8 % (ref 36.0–46.0)
Hemoglobin: 14 g/dL (ref 12.0–15.0)
Lymphs Abs: 2.4 10*3/uL (ref 0.7–4.0)
MCH: 27.1 pg (ref 26.0–34.0)
MCV: 81 fL (ref 78.0–100.0)
Monocytes Relative: 11 % (ref 3–12)
RBC: 5.16 MIL/uL — ABNORMAL HIGH (ref 3.87–5.11)

## 2011-12-22 LAB — COMPLETE METABOLIC PANEL WITH GFR
CO2: 29 mEq/L (ref 19–32)
Calcium: 9 mg/dL (ref 8.4–10.5)
Chloride: 104 mEq/L (ref 96–112)
Creat: 1.05 mg/dL (ref 0.50–1.10)
GFR, Est African American: 69 mL/min
GFR, Est Non African American: 60 mL/min
Glucose, Bld: 79 mg/dL (ref 70–99)
Total Bilirubin: 0.6 mg/dL (ref 0.3–1.2)
Total Protein: 6.8 g/dL (ref 6.0–8.3)

## 2011-12-25 ENCOUNTER — Other Ambulatory Visit: Payer: Self-pay | Admitting: Family Medicine

## 2011-12-25 DIAGNOSIS — R7989 Other specified abnormal findings of blood chemistry: Secondary | ICD-10-CM

## 2012-02-04 ENCOUNTER — Ambulatory Visit (INDEPENDENT_AMBULATORY_CARE_PROVIDER_SITE_OTHER): Payer: BC Managed Care – PPO

## 2012-02-04 DIAGNOSIS — R7989 Other specified abnormal findings of blood chemistry: Secondary | ICD-10-CM

## 2012-02-04 DIAGNOSIS — E041 Nontoxic single thyroid nodule: Secondary | ICD-10-CM

## 2012-06-13 ENCOUNTER — Encounter: Payer: Self-pay | Admitting: Sports Medicine

## 2012-06-13 ENCOUNTER — Ambulatory Visit (INDEPENDENT_AMBULATORY_CARE_PROVIDER_SITE_OTHER): Payer: BC Managed Care – PPO | Admitting: Sports Medicine

## 2012-06-13 VITALS — BP 116/66 | HR 66 | Temp 97.7°F | Wt 155.0 lb

## 2012-06-13 DIAGNOSIS — J020 Streptococcal pharyngitis: Secondary | ICD-10-CM

## 2012-06-13 DIAGNOSIS — J029 Acute pharyngitis, unspecified: Secondary | ICD-10-CM

## 2012-06-13 DIAGNOSIS — E042 Nontoxic multinodular goiter: Secondary | ICD-10-CM

## 2012-06-13 LAB — TSH: TSH: 0.145 u[IU]/mL — ABNORMAL LOW (ref 0.350–4.500)

## 2012-06-13 LAB — T3, FREE: T3, Free: 3.3 pg/mL (ref 2.3–4.2)

## 2012-06-13 LAB — POCT RAPID STREP A (OFFICE): Rapid Strep A Screen: NEGATIVE

## 2012-06-13 LAB — T4, FREE: Free T4: 1.3 ng/dL (ref 0.80–1.80)

## 2012-06-13 MED ORDER — MELOXICAM 15 MG PO TABS: ORAL_TABLET | ORAL | Status: DC

## 2012-06-13 NOTE — Assessment & Plan Note (Signed)
Rapid strep test is negative. Treatment will be symptomatic. I am going to call in some Mobic.

## 2012-06-13 NOTE — Assessment & Plan Note (Signed)
Does not quite meet criteria yet for Plummer Syndrome. She does endorse some palpitations. Recent TSHs have been low but T3 and T4 have been normal. I am going to recheck today. She should follow this up with her primary care physician.

## 2012-06-13 NOTE — Progress Notes (Signed)
Subjective:    CC: Sick  HPI: Joan Yang is a very pleasant 57 year old female with a history of multinodular goiter comes in with a several-day history of sore throat and ear fullness. She denies any sinus pain, pressure, nasal discharge, cough, rash, GI symptoms. She does note some palpitations. Symptoms are moderate, worsening.  Past medical history, Surgical history, Family history not pertinant except as noted below, Social history, Allergies, and medications have been entered into the medical record, reviewed, and no changes needed.   Review of Systems: No fevers, chills, night sweats, weight loss, chest pain, or shortness of breath.   Objective:    General: Well Developed, well nourished, and in no acute distress.  Neuro: Alert and oriented x3, extra-ocular muscles intact, sensation grossly intact.  HEENT: Normocephalic, atraumatic, pupils equal round reactive to light, neck supple, no masses, no lymphadenopathy, thyroid nonpalpable. Mildly erythematous oropharynx, nasopharynx and extremity her canals are unremarkable. Thyroid is palpable, enlarged, nontender without any discrete palpable nodules. Skin: Warm and dry, no rashes. Cardiac: Regular rate and rhythm, no murmurs rubs or gallops.  Respiratory: Clear to auscultation bilaterally. Not using accessory muscles, speaking in full sentences. Impression and Recommendations:

## 2012-08-04 ENCOUNTER — Encounter: Payer: Self-pay | Admitting: Family Medicine

## 2012-08-04 ENCOUNTER — Ambulatory Visit (INDEPENDENT_AMBULATORY_CARE_PROVIDER_SITE_OTHER): Payer: BC Managed Care – PPO | Admitting: Family Medicine

## 2012-08-04 VITALS — BP 106/58 | HR 59 | Ht 65.0 in | Wt 156.0 lb

## 2012-08-04 DIAGNOSIS — R002 Palpitations: Secondary | ICD-10-CM

## 2012-08-04 NOTE — Progress Notes (Signed)
CC: Joan Yang is a 57 y.o. female is here for Palpitations   Subjective: HPI:  Patient complains of palpitations that have been occurring for the past month. They're present on a daily basis. There are only noticeable during periods of rest and most noticeable when trying to fall sleep at night. Nothing in particular has influenced them for better or worse. She has had this in the past and they were result with propranolol but she is no longer taking this. She describes to palpations as a irregular rhythm mild to moderate in severity. During the events she denies any discomfort or sensations out of the ordinary for her.  She denies changing any medications but did recently start GARCINIA for weight loss., She's not sure if the palpitations started around the time she started this weight loss medication. She denies chest pain, shortness of breath, orthopnea, peripheral edema, headache, lightheadedness, confusion, rapid heartbeat, motor sensory disturbances, unintentional weight loss. She drinks one to 2 servings of caffeine 2-3 times a week, symptoms have not coincided with this intake.  Past medical history significant for below lower limit of normal TSH and anemia   Review Of Systems Outlined In HPI  Past Medical History  Diagnosis Date  . ankylosis spondylitis 01/28/2007    Qualifier: Diagnosis of  By: Thomos Lemons    . Palpitations 01/29/2006    Qualifier: Diagnosis of  By: Thomos Lemons       Family History  Problem Relation Age of Onset  . Rheum arthritis Mother      History  Substance Use Topics  . Smoking status: Former Games developer  . Smokeless tobacco: Not on file  . Alcohol Use: Not on file     Objective: Filed Vitals:   08/04/12 1505  BP: 106/58  Pulse: 59    General: Alert and Oriented, No Acute Distress HEENT: Pupils equal, round, reactive to light. Conjunctivae clear.  External ears unremarkable, canals clear with intact TMs with appropriate landmarks.  Middle  ear appears open without effusion. Pink inferior turbinates.  Moist mucous membranes, pharynx without inflammation nor lesions.  Neck supple without palpable lymphadenopathy nor abnormal masses. Lungs: Clear to auscultation bilaterally, no wheezing/ronchi/rales.  Comfortable work of breathing. Good air movement. Cardiac: Regular rate and rhythm. Normal S1/S2.  No murmurs, rubs, nor gallops.   Neuro: CN II-XII grossly intact, full strength/rom of all four extremities, gait normal  Extremities: No peripheral edema.  Strong peripheral pulses.  Mental Status: No depression, anxiety, nor agitation. Skin: Warm and dry.  Assessment & Plan: Joan Yang was seen today for palpitations.  Diagnoses and associated orders for this visit:  Palpitations - CBC - COMPLETE METABOLIC PANEL WITH GFR - Thyroid Panel With TSH    EKG obtained and interpreted by myself: Normal sinus rhythm at a rate of 64 beats per minute. Normal intervals, no pathologic Q waves, normal axis, no ST or segment elevation, no T wave abnormalities.  Palpitations: Will recheck thyroid panel, rule out anemia and electrolyte abnormalities. If labs above are normal we'll consider Holter monitoring plus or minus echocardiogram. Low suspicion for atrial fibrillation at this time.  Return if symptoms worsen or fail to improve.

## 2012-08-05 ENCOUNTER — Encounter: Payer: Self-pay | Admitting: *Deleted

## 2012-08-05 ENCOUNTER — Telehealth: Payer: Self-pay | Admitting: Family Medicine

## 2012-08-05 DIAGNOSIS — I10 Essential (primary) hypertension: Secondary | ICD-10-CM

## 2012-08-05 DIAGNOSIS — E876 Hypokalemia: Secondary | ICD-10-CM | POA: Insufficient documentation

## 2012-08-05 DIAGNOSIS — E0590 Thyrotoxicosis, unspecified without thyrotoxic crisis or storm: Secondary | ICD-10-CM

## 2012-08-05 LAB — THYROID PANEL WITH TSH
T3 Uptake: 31.2 % (ref 22.5–37.0)
TSH: 0.32 u[IU]/mL — ABNORMAL LOW (ref 0.350–4.500)

## 2012-08-05 LAB — COMPLETE METABOLIC PANEL WITH GFR
ALT: 17 U/L (ref 0–35)
AST: 25 U/L (ref 0–37)
Alkaline Phosphatase: 52 U/L (ref 39–117)
Calcium: 9.9 mg/dL (ref 8.4–10.5)
Chloride: 103 mEq/L (ref 96–112)
Creat: 0.99 mg/dL (ref 0.50–1.10)
Potassium: 3.3 mEq/L — ABNORMAL LOW (ref 3.5–5.3)

## 2012-08-05 LAB — CBC
MCHC: 33.7 g/dL (ref 30.0–36.0)
Platelets: 251 10*3/uL (ref 150–400)
RDW: 13.6 % (ref 11.5–15.5)
WBC: 8 10*3/uL (ref 4.0–10.5)

## 2012-08-05 MED ORDER — TRIAMTERENE-HCTZ 37.5-25 MG PO TABS: 1.0000 | ORAL_TABLET | Freq: Every day | ORAL | Status: DC

## 2012-08-05 NOTE — Telephone Encounter (Signed)
Pt notified & is scheduled next wed with dr Linford Arnold.

## 2012-08-05 NOTE — Telephone Encounter (Signed)
Joan Yang, Will you please let Joan Yang know that her blood work showed that her potassium was low which certainly can cause palpitations.  This is certainly due to her taking hydrochlorothiazide, I've sent in a replacement blood pressure medication with the same dose of hydrochlorothiazide and an additional ingredient called triamteriene which helps prevent potassium loss.  This is a common side effect of hydrochlorothiazide.  I'd like her to take this new Rx for one week and see if there's any improvement.  Return in one week for repeat potassium check and symptom follow up.  Also a referral has been placed to endocrinology for sub clinical hyperthyroidism.

## 2012-08-06 ENCOUNTER — Ambulatory Visit: Payer: BC Managed Care – PPO | Admitting: Physician Assistant

## 2012-08-11 ENCOUNTER — Ambulatory Visit: Payer: BC Managed Care – PPO | Admitting: Family Medicine

## 2012-08-13 ENCOUNTER — Encounter: Payer: Self-pay | Admitting: Family Medicine

## 2012-08-13 ENCOUNTER — Ambulatory Visit (INDEPENDENT_AMBULATORY_CARE_PROVIDER_SITE_OTHER): Payer: BC Managed Care – PPO | Admitting: Family Medicine

## 2012-08-13 VITALS — BP 125/69 | HR 63 | Ht 64.0 in | Wt 153.0 lb

## 2012-08-13 DIAGNOSIS — E0590 Thyrotoxicosis, unspecified without thyrotoxic crisis or storm: Secondary | ICD-10-CM

## 2012-08-13 DIAGNOSIS — R140 Abdominal distension (gaseous): Secondary | ICD-10-CM

## 2012-08-13 DIAGNOSIS — R141 Gas pain: Secondary | ICD-10-CM

## 2012-08-13 DIAGNOSIS — E876 Hypokalemia: Secondary | ICD-10-CM

## 2012-08-13 DIAGNOSIS — R002 Palpitations: Secondary | ICD-10-CM

## 2012-08-13 NOTE — Progress Notes (Signed)
Subjective:    Patient ID: Joan Yang, female    DOB: [birth year], 57 y.o.   MRN: [id number]  HPI Feels heart pounding at night at rest.  No rapid HR.  REcently discovered potassium was low so changed diuretic to try to correct this. No worsening or alleviating or sxs. Does  She has backed off on her caffeine. Feels less intense since cutting back on caffeine and changing meds.  Mother had CHF.  Has been really some dicomfort.  Havins some pressure.  Having a lot of gas and burping but not heartburn.  No recent antibiotic.  No lactose intolerance.  No significant abdominal pain or change in bowel movements.  Also has a long-standing history of subclinical hyperthyroidism. She's had this for least 5 years per our records and says she had it with her previous physician and even before that. She's always been asymptomatic. We did recently check levels again when she started having heart pounding and they were stable from previous levels.   Review of Systems BP 125/69  Pulse 63  Ht 5\' 4"  (1.626 m)  Wt 153 lb (69.4 kg)  BMI 26.25 kg/m2    No Known Allergies  Past Medical History  Diagnosis Date  . ankylosis spondylitis 01/28/2007    Qualifier: Diagnosis of  By: Thomos Lemons    . Palpitations 01/29/2006    Qualifier: Diagnosis of  By: Thomos Lemons      History reviewed. No pertinent past surgical history.  History   Social History  . Marital Status: Married    Spouse Name: N/A    Number of Children: N/A  . Years of Education: N/A   Occupational History  . Not on file.   Social History Main Topics  . Smoking status: Former Games developer  . Smokeless tobacco: Not on file  . Alcohol Use: Not on file  . Drug Use: Not on file  . Sexually Active: Not on file   Other Topics Concern  . Not on file   Social History Narrative  . No narrative on file    Family History  Problem Relation Age of Onset  . Rheum arthritis Mother   . Congestive Heart Failure Mother   . Hypertension  Mother     Outpatient Encounter Prescriptions as of 08/13/2012  Medication Sig Dispense Refill  . adalimumab (HUMIRA PEN) 40 MG/0.8ML injection Inject 40 mg into the skin every 14 (fourteen) days.        Marland Kitchen estrogen, conjugated,-medroxyprogesterone (PREMPRO) 0.45-1.5 MG per tablet Take 1 tablet by mouth daily.        . meloxicam (MOBIC) 15 MG tablet One tab PO qAM with breakfast for 2 weeks, then daily prn pain.  30 tablet  3  . triamterene-hydrochlorothiazide (MAXZIDE-25) 37.5-25 MG per tablet Take 1 each (1 tablet total) by mouth daily.  30 tablet  11  . [DISCONTINUED] Alum & Mag Hydroxide-Simeth (MAGIC MOUTHWASH) SOLN Take 5 mLs by mouth 3 (three) times daily.  150 mL  0   No facility-administered encounter medications on file as of 08/13/2012.          Objective:   Physical Exam  Constitutional: She is oriented to person, place, and time. She appears well-developed and well-nourished.  HENT:  Head: Normocephalic and atraumatic.  Cardiovascular: Normal rate, regular rhythm and normal heart sounds.   Pulmonary/Chest: Effort normal and breath sounds normal.  Neurological: She is alert and oriented to person, place, and time.  Skin: Skin is warm and  dry.  Psychiatric: She has a normal mood and affect. Her behavior is normal.          Assessment & Plan:  Heart pounding- Recheck potassium today.  She has also cut back on her caffeine. Overall the heart pounding has improved. It's much less intense. I would like her to give it another 2-3 weeks to see if it resolves completely with the changes that she's made we will recheck her potassium today to make sure that is regulated. I'm not as concerned about the subclinical hyperthyroidism says this is long-standing. This certainly consider it could be contributing to her symptoms we will potentially cancel referral if she improves. She's really not adjusted in an endocrine workup for this. If this continues then consider getting a cardiac  monitor and possibly echo for further evaluation.  Subclinical hyperthyroidism-she's really not interested in a endocrine workup for this. In fact she saw them years ago and they had recommended thyroidectomy and she was not interested. She has never been symptomatic with it.  Bloating and gas - Recommend trial of zantac for 2 weeks if not helping then can try a probiotic.  If still not better come back to see me.  Also have a low threshold to work her up further if her symptoms persist. She has no family history of coronary artery disease and her only significant risk factor is hypertension which is really well controlled.  Time spent 25 min, >50% spent in couseling about her heart pounding, subclinical hyper thyroidism and her recent bloating and gas symptoms.

## 2012-10-11 IMAGING — DX DG DXA BONE DENSITY STUDY HL7
2 series · 2 of 2 positions shown · non-contrast
Comparison: None.

The Bone Mineral Densitometry hard-copy report (which includes all
data, graphical display, and FRAX results when applicable) has been
sent directly to the ordering physician.

This report can also be obtained electronically by viewing images
for this exam through the performing facility's EMR, or by logging
directly into [HOSPITAL] PACS. *RADIOLOGY REPORT*
CLINICAL DATA: 55-year-old post menopausal Caucasian female on
calcium and vitamin D supplementation.

[view not recorded (1 of 2)]
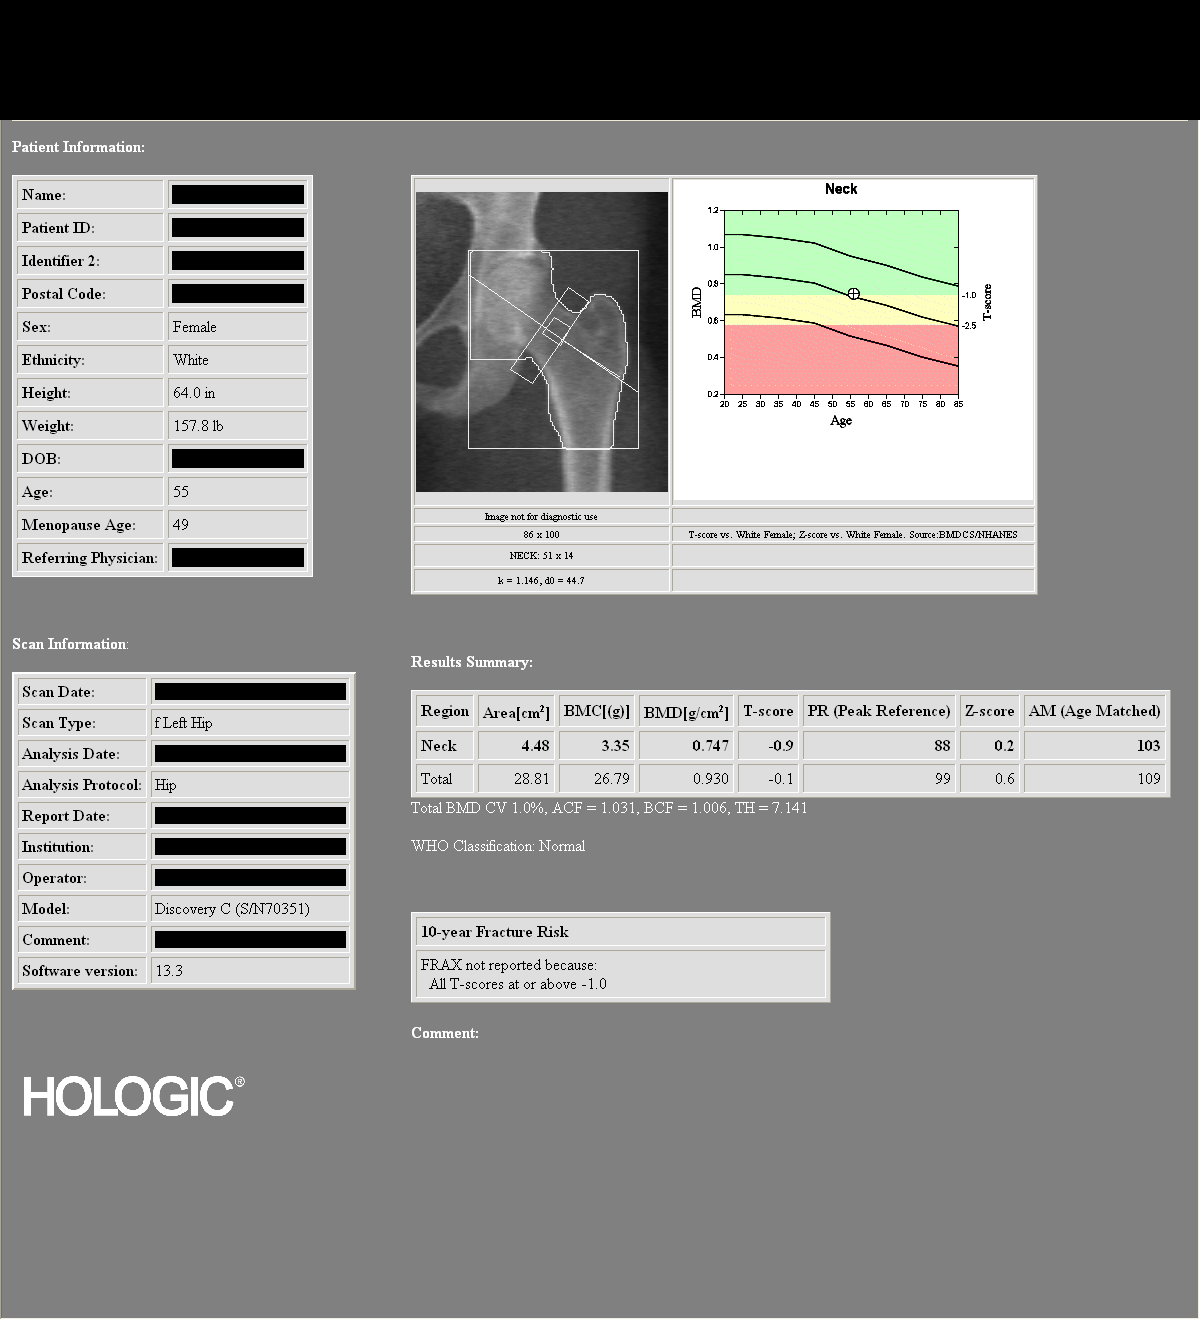

[view not recorded (2 of 2)]
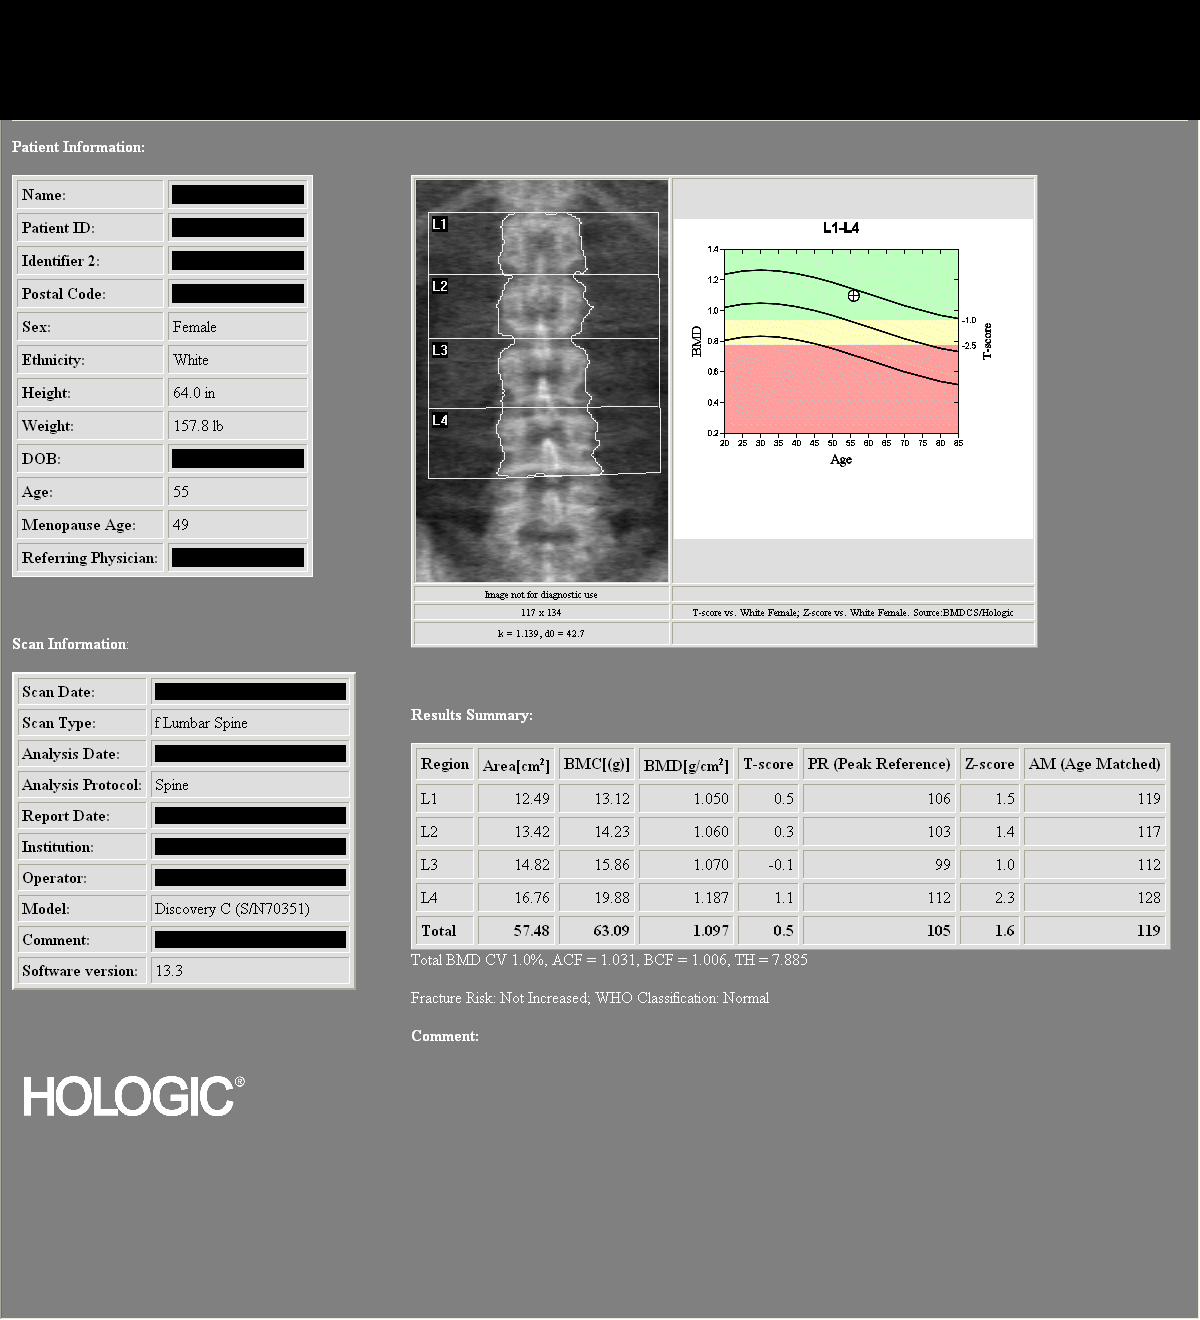

[2 of 2 positions shown; findings below may reference images not displayed]

DUAL X-RAY ABSORPTIOMETRY (DXA) FOR BONE MINERAL DENSITY

AP LUMBAR SPINE

Bone Mineral Density (BMD):            1.097 g/cm2
Young Adult T Score:
Z Score:

LEFT FEMUR NECK

Bone Mineral Density (BMD):             0.747 g/cm2
Young Adult T Score:                           -0.9
Z Score:

ASSESSMENT:  Patient's diagnostic category is NORMAL by WHO
Criteria.

FRACTURE RISK: NOT INCREASED

FRAX: World Health Organization FRAX assessment of absolute
fracture risk is not calculated for this patient because the
patient has normal bone density.
RECOMMENDATIONS:

Effective therapies are available in the form of bisphosphonates,
selective estrogen receptor modulators, biologic agents, and
hormone replacement therapy (for women).  All patients should
ensure an adequate intake of dietary calcium (1200mg daily) and
vitamin D ([address]) unless contraindicated.

All treatment decisions require clinical judgement and
consideration of individual patient factors, including patient
preferences, co-morbidities, previous drug use, risk factors not
captured in the FRAX model (e.g., frailty, falls, vitamin D
deficiency, increased bone turnover, interval significant decline
in bone density) and possible under-or over-estimation of fracture
risk by FRAX.

The National Osteoporosis Foundation recommends that FDA-approved
medical therapies be considered in postmenopausal women and mean
age 50 or older with a:

      1)     Hip or vertebral (clinical or morphometric) fracture.

2)    T-score of -2.5 or lower at the spine or hip.
3)    Ten-year fracture probability by FRAX of 3% or greater for
hip fracture or 20% or greater for major osteoporotic fracture.
FOLLOW-UP:

People with diagnosed cases of osteoporosis or at high risk for
fracture should have regular bone mineral density tests.  For
patients eligible for Medicare, routine testing is allowed once
every 2 years.  The testing frequency can be increased to one year
for patients who have rapidly progressing disease, those who are
receiving or discontinuing medical therapy to restore bone mass, or
have additional risk factors.

World Health Organization (WHO) Criteria:

Normal: T scores from +1.0 to -1.0
Low Bone Mass (Osteopenia): T scores between -1.0 and -2.5
Osteoporosis: T scores -2.5 and below

Comparison to Reference Population:

T score is the key measure used in the diagnosis of osteoporosis
and relative risk determination for fracture.  It provides a value
for bone mass relative to the mean bone mass of a young adult
reference population expressed in terms of standard deviation (SD).

Z score is the age-matched score showing the patient's values
compared to a population matched for age, sex, and race.  This is
also expressed in terms of standard deviation.  The patient may
have values that compare favorably to the age-matched values and
still be at increased risk for fracture.

## 2012-11-07 ENCOUNTER — Other Ambulatory Visit: Payer: Self-pay | Admitting: Family Medicine

## 2012-11-07 ENCOUNTER — Ambulatory Visit (INDEPENDENT_AMBULATORY_CARE_PROVIDER_SITE_OTHER): Payer: BC Managed Care – PPO | Admitting: Family Medicine

## 2012-11-07 ENCOUNTER — Other Ambulatory Visit (HOSPITAL_COMMUNITY)
Admission: RE | Admit: 2012-11-07 | Discharge: 2012-11-07 | Disposition: A | Discharge status: Home / Self Care | Payer: BC Managed Care – PPO | Source: Ambulatory Visit | Attending: Family Medicine | Admitting: Family Medicine

## 2012-11-07 ENCOUNTER — Encounter: Payer: Self-pay | Admitting: Family Medicine

## 2012-11-07 VITALS — BP 121/70 | HR 58 | Ht 64.0 in | Wt 155.0 lb

## 2012-11-07 DIAGNOSIS — Z1151 Encounter for screening for human papillomavirus (HPV): Secondary | ICD-10-CM | POA: Insufficient documentation

## 2012-11-07 DIAGNOSIS — Z124 Encounter for screening for malignant neoplasm of cervix: Secondary | ICD-10-CM

## 2012-11-07 DIAGNOSIS — R590 Localized enlarged lymph nodes: Secondary | ICD-10-CM

## 2012-11-07 DIAGNOSIS — Z01419 Encounter for gynecological examination (general) (routine) without abnormal findings: Secondary | ICD-10-CM

## 2012-11-07 DIAGNOSIS — K5090 Crohn's disease, unspecified, without complications: Secondary | ICD-10-CM

## 2012-11-07 DIAGNOSIS — R599 Enlarged lymph nodes, unspecified: Secondary | ICD-10-CM

## 2012-11-07 DIAGNOSIS — Z0000 Encounter for general adult medical examination without abnormal findings: Secondary | ICD-10-CM

## 2012-11-07 LAB — CBC WITH DIFFERENTIAL/PLATELET
Basophils Absolute: 0 10*3/uL (ref 0.0–0.1)
Basophils Relative: 0 % (ref 0–1)
HCT: 43.3 % (ref 36.0–46.0)
Hemoglobin: 15 g/dL (ref 12.0–15.0)
Lymphocytes Relative: 37 % (ref 12–46)
MCHC: 34.6 g/dL (ref 30.0–36.0)
Neutro Abs: 3.8 10*3/uL (ref 1.7–7.7)
Neutrophils Relative %: 55 % (ref 43–77)
RDW: 13.7 % (ref 11.5–15.5)
WBC: 6.9 10*3/uL (ref 4.0–10.5)

## 2012-11-07 LAB — BASIC METABOLIC PANEL WITH GFR
BUN: 19 mg/dL (ref 6–23)
CO2: 28 mEq/L (ref 19–32)
Chloride: 103 mEq/L (ref 96–112)
Glucose, Bld: 78 mg/dL (ref 70–99)
Potassium: 3.9 mEq/L (ref 3.5–5.3)
Sodium: 140 mEq/L (ref 135–145)

## 2012-11-07 LAB — TSH: TSH: 0.262 u[IU]/mL — ABNORMAL LOW (ref 0.350–4.500)

## 2012-11-07 LAB — LIPID PANEL
HDL: 90 mg/dL (ref >39)
LDL Cholesterol: 135 mg/dL — ABNORMAL HIGH (ref 0–99)
Total CHOL/HDL Ratio: 2.6 Ratio

## 2012-11-07 LAB — HEPATIC FUNCTION PANEL
Bilirubin, Direct: 0.1 mg/dL (ref 0.0–0.3)
Indirect Bilirubin: 0.7 mg/dL (ref 0.0–0.9)
Total Bilirubin: 0.8 mg/dL (ref 0.3–1.2)

## 2012-11-07 NOTE — Patient Instructions (Addendum)
Keep up a regular exercise program and make sure you are eating a healthy diet Try to eat 4 servings of dairy a day, or if you are lactose intolerant take a calcium with vitamin D daily.  Your vaccines are up to date.   

## 2012-11-07 NOTE — Progress Notes (Signed)
Subjective:     Joan Yang is a 57 y.o. female and is here for a comprehensive physical exam. The patient reports problems - lump on right arm pit area x 2.5 -3 monts. tender. no redness or drianage or sing of infection. Really hasn't changed size. No pain nodules in the breast tissue. She is due for her mammogram in about 6-8 weeks. She also see for her eye exam in the fall.  History   Social History  . Marital Status: Married    Spouse Name: N/A    Number of Children: N/A  . Years of Education: N/A   Occupational History  . Not on file.   Social History Main Topics  . Smoking status: Former Games developer  . Smokeless tobacco: Not on file  . Alcohol Use: Not on file  . Drug Use: Not on file  . Sexually Active: Not on file   Other Topics Concern  . Not on file   Social History Narrative  . No narrative on file   Health Maintenance  Topic Date Due  . Influenza Vaccine  12/22/2012  . Pap Smear  11/21/2013  . Mammogram  12/10/2013  . Colonoscopy  04/24/2015  . Tetanus/tdap  07/18/2018    The following portions of the patient's history were reviewed and updated as appropriate: allergies, current medications, past family history, past medical history, past social history, past surgical history and problem list.  Review of Systems A comprehensive review of systems was negative.   Objective:    BP 121/70  Pulse 58  Ht 5\' 4"  (1.626 m)  Wt 155 lb (70.308 kg)  BMI 26.59 kg/m2 Head: Normocephalic, without obvious abnormality, atraumatic Eyes: conj clear, EOMi, PEERLA Ears: normal TM's and external ear canals both ears Nose: Nares normal. Septum midline. Mucosa normal. No drainage or sinus tenderness. Throat: lips, mucosa, and tongue normal; teeth and gums normal Neck: no adenopathy, no carotid bruit, no JVD, supple, symmetrical, trachea midline and thyroid not enlarged, symmetric, no tenderness/mass/nodules Back: symmetric, no curvature. ROM normal. No CVA  tenderness. Lungs: clear to auscultation bilaterally Breasts: normal appearance, no masses or tenderness Heart: regular rate and rhythm, S1, S2 normal, no murmur, click, rub or gallop Abdomen: soft, non-tender; bowel sounds normal; no masses,  no organomegaly Pelvic: cervix normal in appearance, external genitalia normal, no adnexal masses or tenderness, no cervical motion tenderness, rectovaginal septum normal, uterus normal size, shape, and consistency and vagina normal without discharge Extremities: extremities normal, atraumatic, no cyanosis or edema Pulses: 2+ and symmetric Skin: Skin color, texture, turgor normal. No rashes or lesions Lymph nodes: Cervical adenopathy: none, Axillary adenopathy: 1 x 2.5 cm LN in the right axilla, none on the left and Supraclavicular adenopathy: none Neurologic: Alert and oriented X 3, normal strength and tone. Normal symmetric reflexes. Normal coordination and gait    Assessment:    Healthy female exam.      Plan:     See After Visit Summary for Counseling Recommendations  Keep up a regular exercise program and make sure you are eating a healthy diet Try to eat 4 servings of dairy a day, or if you are lactose intolerant take a calcium with vitamin D daily.  Your vaccines are up to date.  Mammo due next month.  Colonoscopy UTD TDap up to date.   Right axillary LN- it has been present for almost 3 months. This is a little concerning. Typically a swollen will resolve after one to 2 months. She says it  has not changed it is still somewhat tender. I did not see any evidence of erythema or infection on exam. We'll schedule her for an ultrasound to confirm if it's a lymph node versus a cyst et Karie Soda. Her last mammogram was in August of last year so technically she is up-to-date. She does plan on scheduling the same.   HTN- well controlled. F/U in 6 months.  Due for CMP and lipids  Her rheumatologist requests a hepatic panel and a CBC. Wide-based her  labs for ankylosing spondylitis and medication management

## 2012-11-10 ENCOUNTER — Other Ambulatory Visit: Payer: Self-pay | Admitting: *Deleted

## 2012-11-10 MED ORDER — CONJ ESTROG-MEDROXYPROGEST ACE 0.45-1.5 MG PO TABS: 1.0000 | ORAL_TABLET | Freq: Every day | ORAL | Status: DC

## 2012-11-11 ENCOUNTER — Telehealth: Payer: Self-pay | Admitting: *Deleted

## 2012-11-11 DIAGNOSIS — R599 Enlarged lymph nodes, unspecified: Secondary | ICD-10-CM

## 2012-11-11 NOTE — Telephone Encounter (Signed)
Breast ultrasound reordered for the breast center.

## 2012-11-12 ENCOUNTER — Other Ambulatory Visit: Payer: Self-pay | Admitting: Family Medicine

## 2012-11-12 DIAGNOSIS — R599 Enlarged lymph nodes, unspecified: Secondary | ICD-10-CM

## 2012-12-05 IMAGING — US US SOFT TISSUE HEAD/NECK
1 series · 14 of 25 positions shown · non-contrast
Comparison: None.

CLINICAL DATA: Enlarged thyroid on physical.

THYROID ULTRASOUND
TECHNIQUE: Ultrasound examination of the thyroid gland and adjacent
soft tissues was performed.

[Series 1: us soft tissue head/neck · 0.08mm/px · 14 of 65 slices shown]
[im 1/65]
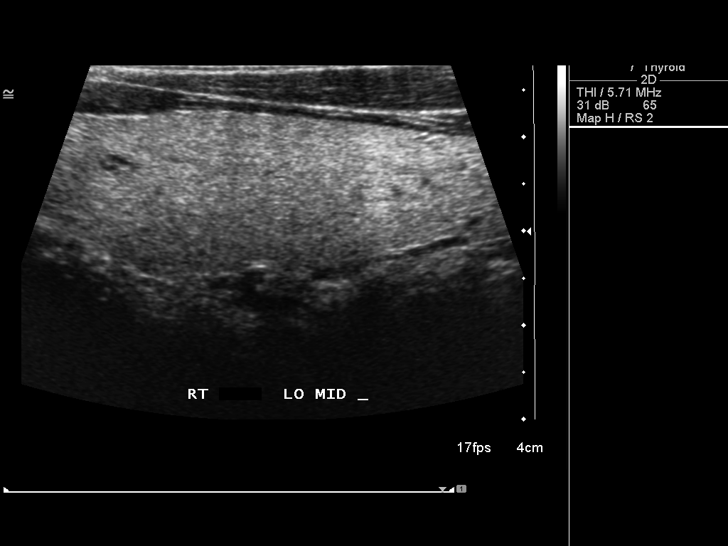
[im 6/65]
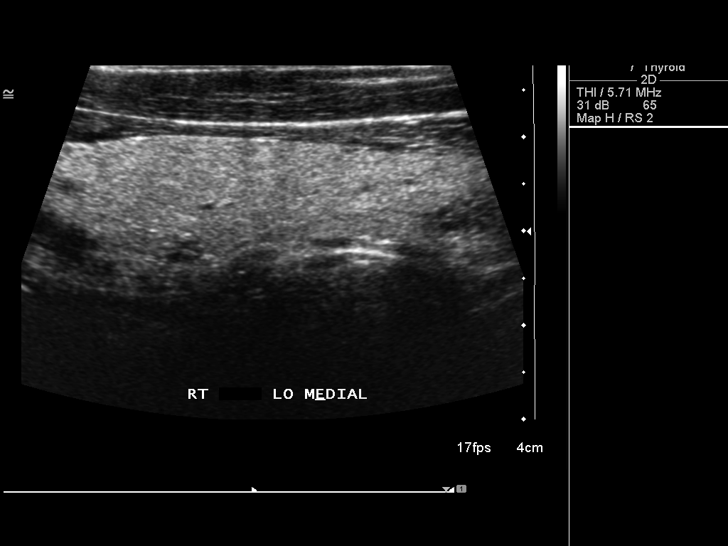
[im 11/65]
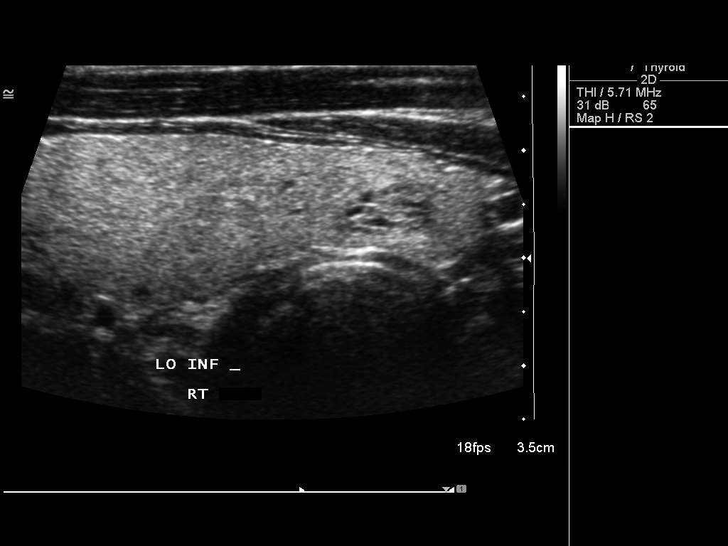
[im 17/65]
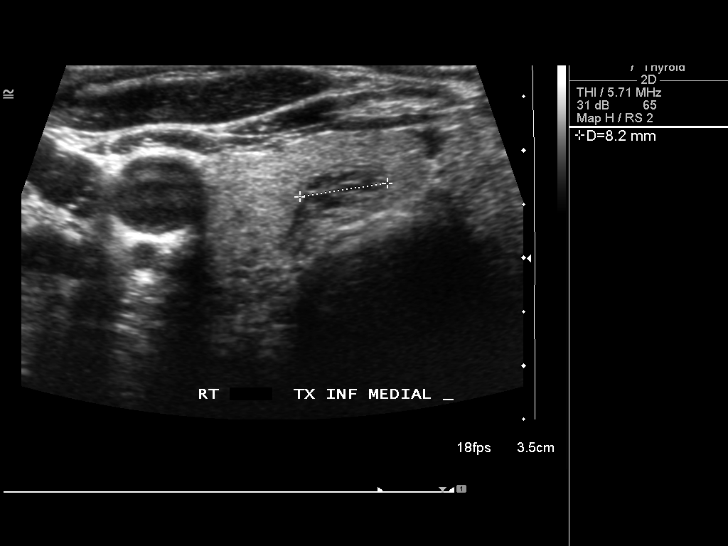
[im 22/65]
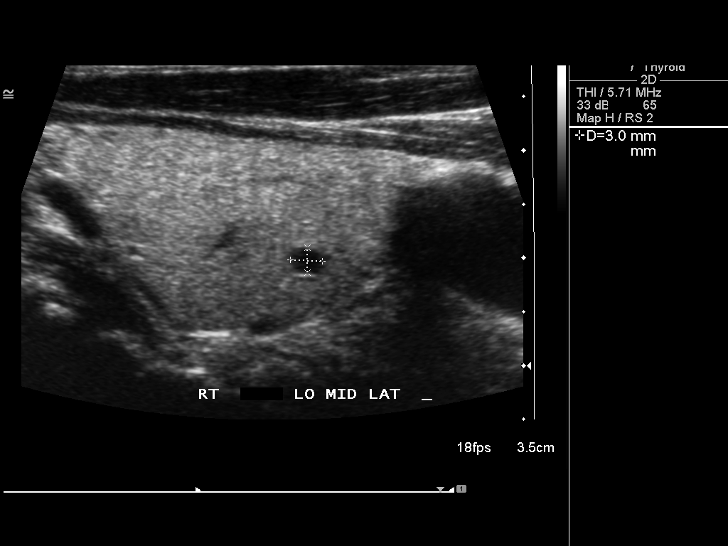
[im 25/65]
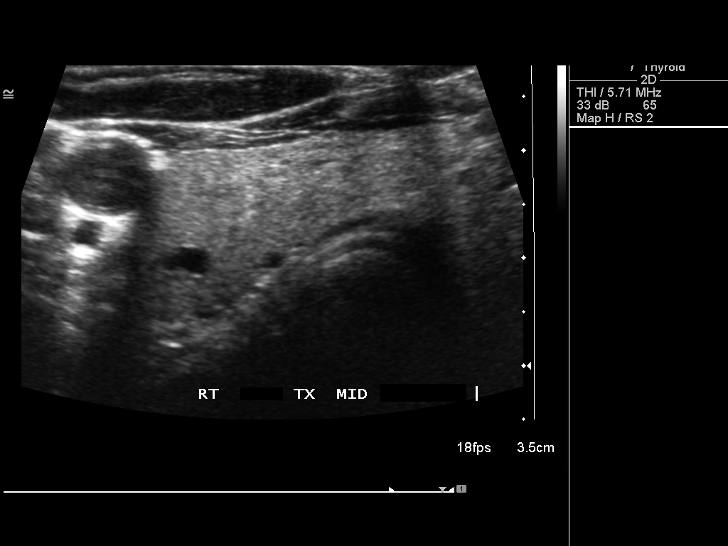
[im 30/65]
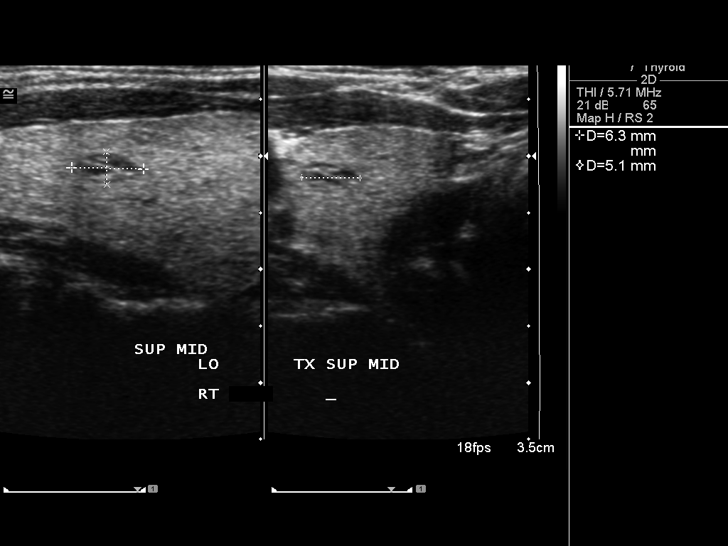
[im 35/65]
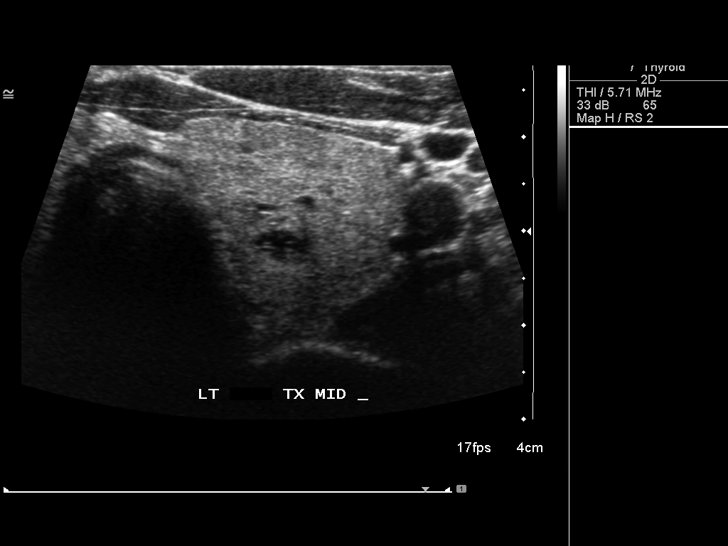
[im 41/65]
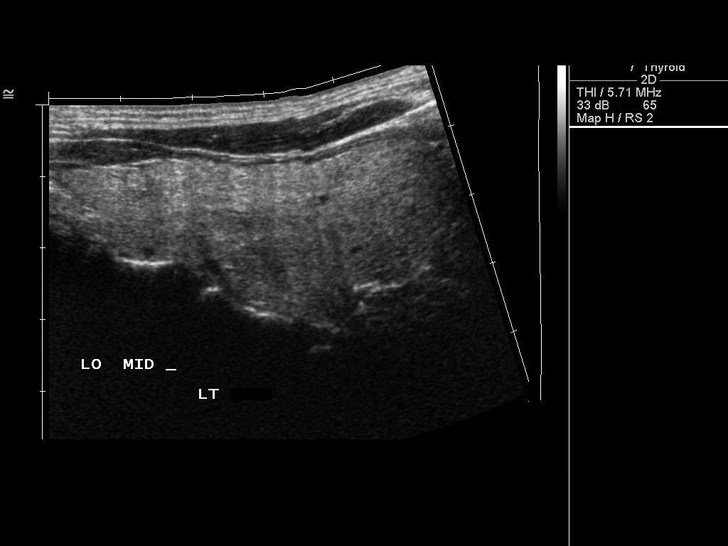
[im 43/65]
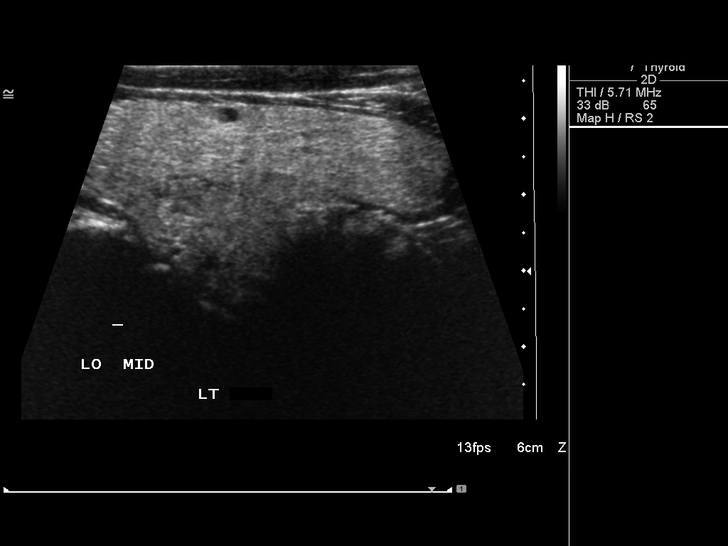
[im 49/65]
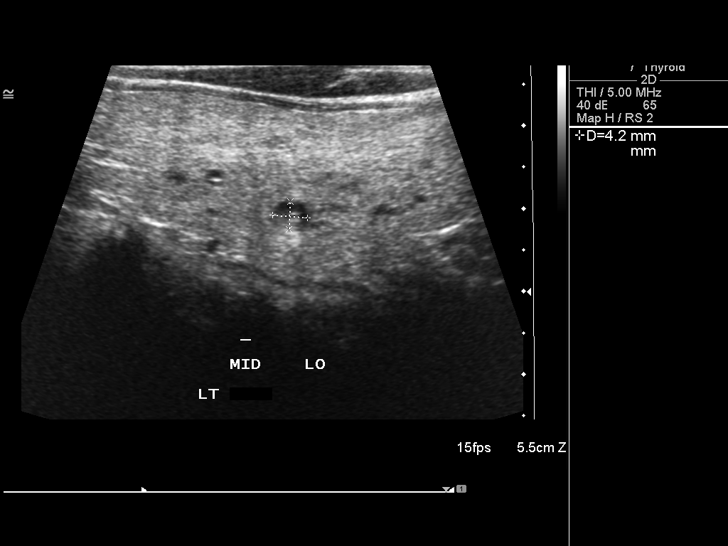
[im 54/65]
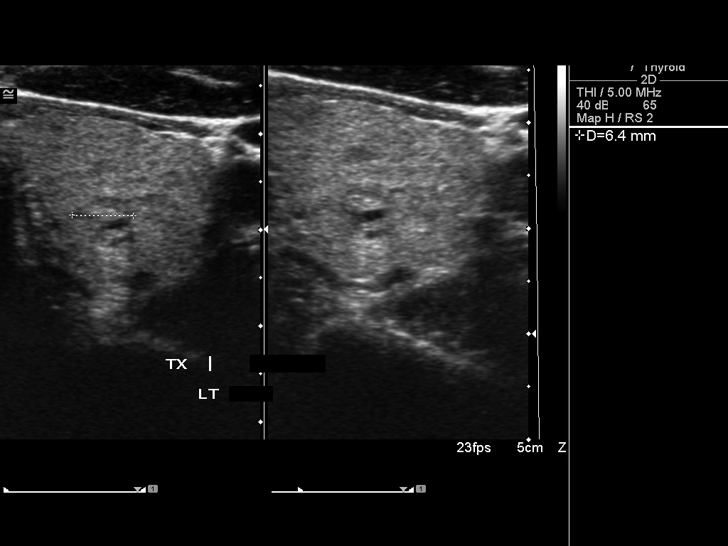
[im 59/65]
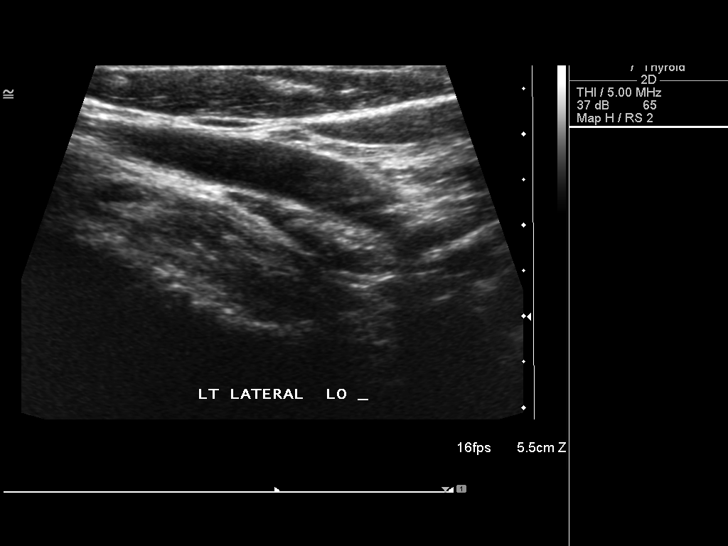
[im 65/65]
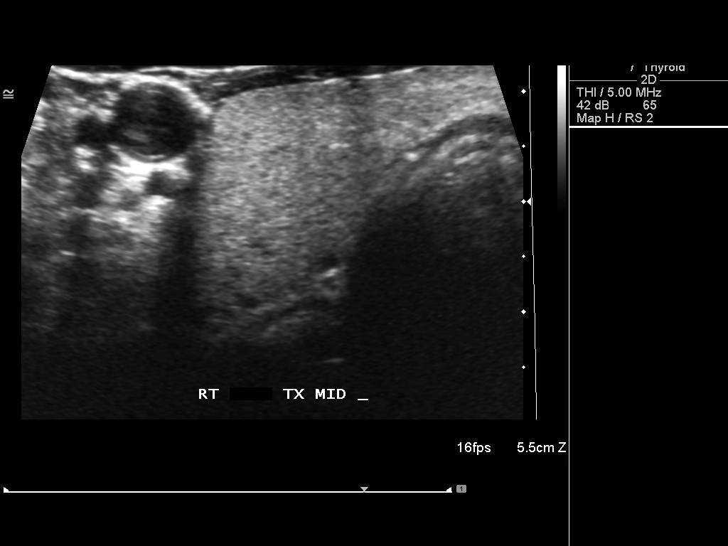

[14 of 25 positions shown; findings below may reference images not displayed]

FINDINGS: Right thyroid lobe:  5.0 x 1.9 x 2.1 cm.
Left thyroid lobe:  5.7 x 2.3 x 2.7 cm.
Isthmus:  7 mm.

Focal nodules:  Numerous small bilateral subcentimeteric thyroid
nodules.  The largest nodule is a 9 mm nodule in the right lower
pole.  Numerous other bilateral thyroid nodules 7 mm or less in
size.

Lymphadenopathy:  None visualized.
IMPRESSION: Numerous bilateral subcentimeteric thyroid nodules, most likely
multinodular goiter.  No dominant nodule.

## 2012-12-12 ENCOUNTER — Ambulatory Visit
Admission: RE | Admit: 2012-12-12 | Discharge: 2012-12-12 | Disposition: A | Discharge status: Home / Self Care | Payer: BC Managed Care – PPO | Source: Ambulatory Visit | Attending: Family Medicine | Admitting: Family Medicine

## 2012-12-12 DIAGNOSIS — R599 Enlarged lymph nodes, unspecified: Secondary | ICD-10-CM

## 2013-02-26 ENCOUNTER — Other Ambulatory Visit: Payer: Self-pay

## 2013-05-14 ENCOUNTER — Other Ambulatory Visit: Payer: Self-pay | Admitting: Family Medicine

## 2013-07-27 ENCOUNTER — Ambulatory Visit (INDEPENDENT_AMBULATORY_CARE_PROVIDER_SITE_OTHER): Payer: BC Managed Care – PPO | Admitting: Family Medicine

## 2013-07-27 ENCOUNTER — Encounter: Payer: Self-pay | Admitting: Family Medicine

## 2013-07-27 VITALS — BP 122/59 | HR 78 | Ht 64.0 in | Wt 153.0 lb

## 2013-07-27 DIAGNOSIS — E876 Hypokalemia: Secondary | ICD-10-CM

## 2013-07-27 DIAGNOSIS — Z79890 Hormone replacement therapy (postmenopausal): Secondary | ICD-10-CM

## 2013-07-27 DIAGNOSIS — I10 Essential (primary) hypertension: Secondary | ICD-10-CM

## 2013-07-27 MED ORDER — TRIAMTERENE-HCTZ 37.5-25 MG PO TABS: 1.0000 | ORAL_TABLET | Freq: Every day | ORAL | Status: DC

## 2013-07-27 MED ORDER — CONJ ESTROG-MEDROXYPROGEST ACE 0.45-1.5 MG PO TABS: 1.0000 | ORAL_TABLET | Freq: Every day | ORAL | Status: DC

## 2013-07-27 MED ORDER — CONJ ESTROG-MEDROXYPROGEST ACE 0.45-1.5 MG PO TABS
1.0000 | ORAL_TABLET | Freq: Every day | ORAL | Status: DC
Start: 2013-07-27 — End: 2014-01-31

## 2013-07-27 NOTE — Progress Notes (Signed)
   Subjective:    Patient ID: Joan Yang, female    DOB: 01/23/1956, 58 y.o.   MRN: [id number]  HPI Hypertension- Pt denies chest pain, SOB, dizziness, or heart palpitations.  Taking meds as directed w/o problems.  Denies medication side effects.    HRT - doing well on hormone therapy. She says that she doesn't take it she gets bad night sweats. She did try taking it every other day for a period time but had a lot of difficulty so started taking it regularly again.   Review of Systems     Objective:   Physical Exam  Constitutional: She is oriented to person, place, and time. She appears well-developed and well-nourished.  HENT:  Head: Normocephalic and atraumatic.  Cardiovascular: Normal rate, regular rhythm and normal heart sounds.   Pulmonary/Chest: Effort normal and breath sounds normal.  Neurological: She is alert and oriented to person, place, and time.  Skin: Skin is warm and dry.  Psychiatric: She has a normal mood and affect. Her behavior is normal.          Assessment & Plan:  HTN - well controlled. Continue current regimen. Followup in 6 months. Call if any problems with the medication.  Hormone replacement therapy-doing well. She has tried to taper down some but was unable to do it successfully. Contrast to get her followup in 6 months. She'll be due for a Pap smear at that time.

## 2013-09-21 ENCOUNTER — Ambulatory Visit (INDEPENDENT_AMBULATORY_CARE_PROVIDER_SITE_OTHER): Payer: BC Managed Care – PPO | Admitting: Family Medicine

## 2013-09-21 ENCOUNTER — Encounter: Payer: Self-pay | Admitting: Family Medicine

## 2013-09-21 VITALS — BP 111/57 | HR 66 | Wt 158.0 lb

## 2013-09-21 DIAGNOSIS — J019 Acute sinusitis, unspecified: Secondary | ICD-10-CM

## 2013-09-21 MED ORDER — AMOXICILLIN-POT CLAVULANATE 875-125 MG PO TABS
1.0000 | ORAL_TABLET | Freq: Two times a day (BID) | ORAL | Status: DC
Start: 2013-09-21 — End: 2014-03-30

## 2013-09-21 MED ORDER — HYDROCODONE-HOMATROPINE 5-1.5 MG/5ML PO SYRP: 5.0000 mL | ORAL_SOLUTION | Freq: Every evening | ORAL | Status: DC | PRN

## 2013-09-21 NOTE — Patient Instructions (Signed)
Amoxicillin; Clavulanic Acid extended-release tablets What is this medicine? AMOXICILLIN; CLAVULANIC ACID (a mox i SILL in; KLAV yoo lan ic AS id) is a penicillin antibiotic. It is used to treat certain kinds of bacterial infections. It will not work for colds, flu, or other viral infections. This medicine may be used for other purposes; ask your health care provider or pharmacist if you have questions. COMMON BRAND NAME(S): Augmentin XR What should I tell my health care provider before I take this medicine? They need to know if you have any of these conditions: -bowel disease, like colitis -kidney disease -liver disease -mononucleosis -an unusual or allergic reaction to amoxicillin, penicillin, cephalosporin, other antibiotics, clavulanic acid, other medicines, foods, dyes, or preservatives -pregnant or trying to get pregnant -breast-feeding How should I use this medicine? Take this medicine by mouth with a full glass of water. Follow the directions on the prescription label. Take at the start of a meal. Do not crush or chew. You may cut this medicine in half at the score line for easier swallowing. Take your medicine at regular intervals. Do not take your medicine more often than directed. Take all of your medicine as directed even if you think you are better. Do not skip doses or stop your medicine early. Contact your pediatrician or health care professional regarding the use of this medicine in children. This medicine has been used in children as young as 46 years of age. Overdosage: If you think you have taken too much of this medicine contact a poison control center or emergency room at once. NOTE: This medicine is only for you. Do not share this medicine with others. What if I miss a dose? If you miss a dose, take it as soon as you can. If it is almost time for your next dose, take only that dose. Do not take double or extra doses. What may interact with this  medicine? -allopurinol -anticoagulants -birth control pills -methotrexate -probenecid This list may not describe all possible interactions. Give your health care provider a list of all the medicines, herbs, non-prescription drugs, or dietary supplements you use. Also tell them if you smoke, drink alcohol, or use illegal drugs. Some items may interact with your medicine. What should I watch for while using this medicine? Tell your doctor or health care professional if your symptoms do not improve. Do not treat diarrhea with over the counter products. Contact your doctor if you have diarrhea that lasts more than 2 days or if it is severe and watery. If you have diabetes, you may get a false-positive result for sugar in your urine. Check with your doctor or health care professional. Birth control pills may not work properly while you are taking this medicine. Talk to your doctor about using an extra method of birth control. What side effects may I notice from receiving this medicine? Side effects that you should report to your doctor or health care professional as soon as possible: -allergic reactions like skin rash, itching or hives, swelling of the face, lips, or tongue -breathing problems -dark urine -fever or chills, sore throat -redness, blistering, peeling or loosening of the skin, including inside the mouth -seizures -trouble passing urine or change in the amount of urine -unusual bleeding, bruising -unusually weak or tired -white patches or sores in the mouth or throat Side effects that usually do not require medical attention (report to your doctor or health care professional if they continue or are bothersome): -diarrhea -dizziness -headache -nausea, vomiting -stomach  upset -vaginal or anal irritation This list may not describe all possible side effects. Call your doctor for medical advice about side effects. You may report side effects to FDA at 1-800-FDA-1088. Where should I  keep my medicine? Keep out of the reach of children. Store at room temperature below 25 degrees C (77 degrees F). Keep container tightly closed. Throw away any unused medicine after the expiration date. NOTE: This sheet is a summary. It may not cover all possible information. If you have questions about this medicine, talk to your doctor, pharmacist, or health care provider.  2014, Elsevier/Gold Standard. (2007-07-01 14:32:45) Homatropine; Hydrocodone oral syrup What is this medicine? HYDROCODONE (hye droe KOE done) is used to help relieve cough. This medicine may be used for other purposes; ask your health care provider or pharmacist if you have questions. COMMON BRAND NAME(S): Hycodan, Hydromet, Hydropane, Mycodone What should I tell my health care provider before I take this medicine? They need to know if you have any of these conditions: -brain tumor -drug abuse or addiction -head injury -heart disease -if you frequently drink alcohol-containing drinks -kidney disease -liver disease -lung disease, asthma, or breathing problems -mental problems -an allergic reaction to hydrocodone, other opioid analgesics, other medicines, foods, dyes, or preservatives -pregnant or trying to get pregnant -breast-feeding How should I use this medicine? Take this medicine by mouth with a full glass of water. Use a specially marked spoon or dropper to measure your dose. Ask your pharmacist if you do not have one. Do not use a household spoon. Follow the directions on the prescription label. Take your medicine at regular intervals. Do not take your medicine more often than directed. Talk to your pediatrician regarding the use of this medicine in children. This medicine is not approved for use in children less than 64 years old. Patients over 57 years old may have a stronger reaction and need a smaller dose. Overdosage: If you think you have taken too much of this medicine contact a poison control center or  emergency room at once. NOTE: This medicine is only for you. Do not share this medicine with others. What if I miss a dose? If you miss a dose, take it as soon as you can. If it is almost time for your next dose, take only that dose. Do not take double or extra doses. What may interact with this medicine? -alcohol -antihistamines -MAOIs like Carbex, Eldepryl, Marplan, Nardil, and Parnate -medicines for depression, anxiety, or psychotic disturbances -medicines for sleep -muscle relaxants -naltrexone -narcotic medicines (opiates) for pain -tramadol -tricyclic antidepressants like amitriptyline, clomipramine, desipramine, doxepin, imipramine, nortriptyline, and protriptyline This list may not describe all possible interactions. Give your health care provider a list of all the medicines, herbs, non-prescription drugs, or dietary supplements you use. Also tell them if you smoke, drink alcohol, or use illegal drugs. Some items may interact with your medicine. What should I watch for while using this medicine? You may develop tolerance to this medicine if you take it for a long time. Tolerance means that you will get less cough relief with time. Tell your doctor or health care professional if you symptoms do not improve or if they get worse. Do not suddenly stop taking your medicine because you may develop a severe reaction. Your body becomes used to the medicine. This does NOT mean you are addicted. Addiction is a behavior related to getting and using a drug for a non-medical reason. If your doctor wants you to stop the  medicine, the dose will be slowly lowered over time to avoid any side effects. You may get drowsy or dizzy. Do not drive, use machinery, or do anything that needs mental alertness until you know how this medicine affects you. Do not stand or sit up quickly, especially if you are an older patient. This reduces the risk of dizzy or fainting spells. Alcohol may interfere with the effect of  this medicine. Avoid alcoholic drinks. This medicine will cause constipation. Try to have a bowel movement at least every 2 to 3 days. If you do not have a bowel movement for 3 days, call your doctor or health care professional. What side effects may I notice from receiving this medicine? Side effects that you should report to your doctor or health care professional as soon as possible: -allergic reactions like skin rash, itching or hives, swelling of the face, lips, or tongue -breathing difficulties, wheezing -confusion -light headedness or fainting spells Side effects that usually do not require medical attention (report to your doctor or health care professional if they continue or are bothersome): -dizziness -drowsiness -itching -nausea -vomiting This list may not describe all possible side effects. Call your doctor for medical advice about side effects. You may report side effects to FDA at 1-800-FDA-1088. Where should I keep my medicine? Keep out of the reach of children. This medicine can be abused. Keep your medicine in a safe place to protect it from theft. Do not share this medicine with anyone. Selling or giving away this medicine is dangerous and against the law. Store at room temperature between 15 and 30 degrees C (59 and 86 degrees F). Protect from light. Throw away any unused medicine after the expiration date. Discard unused medicine and used packaging carefully. Pets and children can be harmed if they find used or lost packages. NOTE: This sheet is a summary. It may not cover all possible information. If you have questions about this medicine, talk to your doctor, pharmacist, or health care provider.  2014, Elsevier/Gold Standard. (2010-12-18 10:04:07)

## 2013-09-21 NOTE — Progress Notes (Signed)
   Subjective:    Patient ID: Joan Yang, female    DOB: December 03, 1955, 58 y.o.   MRN: [id number]  HPI 1 week of cough with significant postnasal drainage and congestion. No facial pain or pressure or headaches. She's had significant sore throat and ear pain. No fevers chills or sweats. Feels extremely fatigued and run down. She has been using some over-the-counter Robitussin for cough. The cough is worse at night. No sneezing or watery eyes.   Review of Systems    Objective:   Physical Exam  Constitutional: She is oriented to person, place, and time. She appears well-developed and well-nourished.  HENT:  Head: Normocephalic and atraumatic.  Right Ear: External ear normal.  Left Ear: External ear normal.  Nose: Nose normal.  Mouth/Throat: Oropharynx is clear and moist.  TMs and canals are clear.   Eyes: Conjunctivae and EOM are normal. Pupils are equal, round, and reactive to light.  Neck: Neck supple. No thyromegaly present.  Cardiovascular: Normal rate, regular rhythm and normal heart sounds.   Pulmonary/Chest: Effort normal and breath sounds normal. She has no wheezes.  Lymphadenopathy:    She has no cervical adenopathy.  Neurological: She is alert and oriented to person, place, and time.  Skin: Skin is warm and dry.  Psychiatric: She has a normal mood and affect.          Assessment & Plan:  Acute sinusitis - will treat with Augmentin. Also given a cough syrup for bedtime. Call if not better in one week. Continue symptomatic care as needed.

## 2013-11-09 ENCOUNTER — Other Ambulatory Visit: Payer: Self-pay | Admitting: Family Medicine

## 2013-11-09 DIAGNOSIS — Z1231 Encounter for screening mammogram for malignant neoplasm of breast: Secondary | ICD-10-CM

## 2013-12-17 ENCOUNTER — Ambulatory Visit: Payer: BC Managed Care – PPO

## 2013-12-31 ENCOUNTER — Ambulatory Visit: Payer: BC Managed Care – PPO

## 2014-01-07 ENCOUNTER — Ambulatory Visit (INDEPENDENT_AMBULATORY_CARE_PROVIDER_SITE_OTHER): Payer: BC Managed Care – PPO

## 2014-01-07 DIAGNOSIS — Z1231 Encounter for screening mammogram for malignant neoplasm of breast: Secondary | ICD-10-CM

## 2014-01-26 ENCOUNTER — Encounter: Payer: BC Managed Care – PPO | Admitting: Family Medicine

## 2014-01-31 ENCOUNTER — Other Ambulatory Visit: Payer: Self-pay | Admitting: Family Medicine

## 2014-03-08 ENCOUNTER — Other Ambulatory Visit: Payer: Self-pay | Admitting: Family Medicine

## 2014-03-25 ENCOUNTER — Ambulatory Visit: Payer: Self-pay | Admitting: Family Medicine

## 2014-03-30 ENCOUNTER — Encounter: Payer: Self-pay | Admitting: Family Medicine

## 2014-03-30 ENCOUNTER — Ambulatory Visit (INDEPENDENT_AMBULATORY_CARE_PROVIDER_SITE_OTHER): Payer: BC Managed Care – PPO | Admitting: Family Medicine

## 2014-03-30 ENCOUNTER — Other Ambulatory Visit: Payer: Self-pay | Admitting: Family Medicine

## 2014-03-30 ENCOUNTER — Ambulatory Visit (INDEPENDENT_AMBULATORY_CARE_PROVIDER_SITE_OTHER): Payer: BC Managed Care – PPO | Admitting: *Deleted

## 2014-03-30 VITALS — BP 111/57 | HR 64 | Temp 97.9°F | Ht 64.0 in | Wt 157.0 lb

## 2014-03-30 DIAGNOSIS — Z1239 Encounter for other screening for malignant neoplasm of breast: Secondary | ICD-10-CM

## 2014-03-30 DIAGNOSIS — R05 Cough: Secondary | ICD-10-CM | POA: Diagnosis not present

## 2014-03-30 DIAGNOSIS — Z23 Encounter for immunization: Secondary | ICD-10-CM | POA: Diagnosis not present

## 2014-03-30 DIAGNOSIS — R053 Chronic cough: Secondary | ICD-10-CM

## 2014-03-30 DIAGNOSIS — Z0000 Encounter for general adult medical examination without abnormal findings: Secondary | ICD-10-CM | POA: Diagnosis not present

## 2014-03-30 MED ORDER — CONJ ESTROG-MEDROXYPROGEST ACE 0.45-1.5 MG PO TABS: 1.0000 | ORAL_TABLET | Freq: Every day | ORAL | Status: DC

## 2014-03-30 MED ORDER — TRIAMTERENE-HCTZ 37.5-25 MG PO TABS: 1.0000 | ORAL_TABLET | Freq: Every day | ORAL | Status: DC

## 2014-03-30 NOTE — Patient Instructions (Signed)
complete physical examination Keep up a regular exercise program and make sure you are eating a healthy diet Try to eat 4 servings of dairy a day, or if you are lactose intolerant take a calcium with vitamin D daily.  Your vaccines are up to date.   

## 2014-03-30 NOTE — Progress Notes (Signed)
Subjective:     Joan Yang is a 58 y.o. female and is here for a comprehensive physical exam. The patient reports no problems.  Had a persistant cough after sinus infection in June. Says finally went away for about 2 months.  Cough has started again and this time more mild. + postnasal drainage. Not keeping her up at night. Feels like from her throat. Not her chest.    History   Social History  . Marital Status: Married    Spouse Name: N/A    Number of Children: N/A  . Years of Education: N/A   Occupational History  . Not on file.   Social History Main Topics  . Smoking status: Former Games developermoker  . Smokeless tobacco: Not on file  . Alcohol Use: Not on file  . Drug Use: Not on file  . Sexual Activity: Not on file   Other Topics Concern  . Not on file   Social History Narrative   Health Maintenance  Topic Date Due  . INFLUENZA VACCINE  11/21/2013  . COLONOSCOPY  04/24/2015  . PAP SMEAR  11/08/2015  . MAMMOGRAM  01/08/2016  . TETANUS/TDAP  07/18/2018    The following portions of the patient's history were reviewed and updated as appropriate: allergies, current medications, past family history, past medical history, past social history, past surgical history and problem list.  Review of Systems A comprehensive review of systems was negative.   Objective:    BP 111/57 mmHg  Pulse 64  Temp(Src) 97.9 F (36.6 C)  Ht 5\' 4"  (1.626 m)  Wt 157 lb (71.215 kg)  BMI 26.94 kg/m2  SpO2 100% General appearance: alert, cooperative, appears stated age and clearing throat several times during teh OV Head: Normocephalic, without obvious abnormality, atraumatic Eyes: conjc clear, EOMI, PEERLa Ears: normal TM's and external ear canals both ears Nose: Nares normal. Septum midline. Mucosa normal. No drainage or sinus tenderness. Throat: lips, mucosa, and tongue normal; teeth and gums normal Neck: no adenopathy, no carotid bruit, no JVD, supple, symmetrical, trachea midline and  thyroid not enlarged, symmetric, no tenderness/mass/nodules Back: symmetric, no curvature. ROM normal. No CVA tenderness. Lungs: clear to auscultation bilaterally Heart: regular rate and rhythm, S1, S2 normal, no murmur, click, rub or gallop Abdomen: soft, non-tender; bowel sounds normal; no masses,  no organomegaly Pelvic: deferred Extremities: extremities normal, atraumatic, no cyanosis or edema Pulses: 2+ and symmetric Skin: Skin color, texture, turgor normal. No rashes or lesions Lymph nodes: Cervical, supraclavicular, and axillary nodes normal. Neurologic: Alert and oriented X 3, normal strength and tone. Normal symmetric reflexes. Normal coordination and gait    Assessment:    Healthy female exam.      Plan:     See After Visit Summary for Counseling Recommendations  Keep up a regular exercise program and make sure you are eating a healthy diet Try to eat 4 servings of dairy a day, or if you are lactose intolerant take a calcium with vitamin D daily.  Your vaccines are up to date.    Cough, chronic - she says she plans on trying a dairy free trial just to see if it helps with her symptoms. I think this is reasonable. She does have allergies so even though she's not having significant congestion or itching I think an is a steroid trial may be warranted. She could try this for 2 weeks. She could also be having silent reflux if she does not improve consider a trial of over-the-counter PPI  for 2 weeks to see if it helps her symptoms or not. If after this she's not improving then please come back for further evaluation. I did not order a chest x-ray she's really not having any chest symptoms and her lungs are clear today. Really feels like it's coming from excessive postnasal drip and drainage in her throat.

## 2014-03-31 LAB — COMPLETE METABOLIC PANEL WITH GFR
ALK PHOS: 47 U/L (ref 39–117)
ALT: 15 U/L (ref 0–35)
AST: 26 U/L (ref 0–37)
Albumin: 3.9 g/dL (ref 3.5–5.2)
BILIRUBIN TOTAL: 0.9 mg/dL (ref 0.2–1.2)
BUN: 16 mg/dL (ref 6–23)
CO2: 29 mEq/L (ref 19–32)
Calcium: 8.9 mg/dL (ref 8.4–10.5)
Chloride: 102 mEq/L (ref 96–112)
Creat: 0.88 mg/dL (ref 0.50–1.10)
GFR, EST NON AFRICAN AMERICAN: 73 mL/min
GFR, Est African American: 84 mL/min
Glucose, Bld: 75 mg/dL (ref 70–99)
Potassium: 3.9 mEq/L (ref 3.5–5.3)
Sodium: 140 mEq/L (ref 135–145)
Total Protein: 7.2 g/dL (ref 6.0–8.3)

## 2014-03-31 LAB — TSH: TSH: 0.197 u[IU]/mL — ABNORMAL LOW (ref 0.350–4.500)

## 2014-03-31 LAB — LIPID PANEL
CHOL/HDL RATIO: 2.7 ratio
CHOLESTEROL: 225 mg/dL — AB (ref 0–200)
HDL: 84 mg/dL (ref >39)
LDL CALC: 125 mg/dL — AB (ref 0–99)
TRIGLYCERIDES: 82 mg/dL (ref <150)
VLDL: 16 mg/dL (ref 0–40)

## 2014-04-01 LAB — T3, FREE: T3, Free: 2.7 pg/mL (ref 2.3–4.2)

## 2014-04-01 LAB — T4, FREE: FREE T4: 1.21 ng/dL (ref 0.80–1.80)

## 2014-04-05 ENCOUNTER — Other Ambulatory Visit: Payer: Self-pay | Admitting: *Deleted

## 2014-04-05 DIAGNOSIS — E0590 Thyrotoxicosis, unspecified without thyrotoxic crisis or storm: Secondary | ICD-10-CM

## 2014-04-24 ENCOUNTER — Other Ambulatory Visit: Payer: Self-pay | Admitting: Family Medicine

## 2014-06-01 ENCOUNTER — Other Ambulatory Visit: Payer: Self-pay | Admitting: Family Medicine

## 2014-06-03 ENCOUNTER — Other Ambulatory Visit: Payer: Self-pay | Admitting: *Deleted

## 2014-06-03 MED ORDER — CONJ ESTROG-MEDROXYPROGEST ACE 0.45-1.5 MG PO TABS: 1.0000 | ORAL_TABLET | Freq: Every day | ORAL | Status: DC

## 2014-07-26 ENCOUNTER — Encounter: Payer: Self-pay | Admitting: Family Medicine

## 2014-07-26 ENCOUNTER — Ambulatory Visit (INDEPENDENT_AMBULATORY_CARE_PROVIDER_SITE_OTHER): Payer: BLUE CROSS/BLUE SHIELD | Admitting: Family Medicine

## 2014-07-26 VITALS — BP 118/64 | HR 68 | Temp 98.4°F | Resp 16 | Ht 64.0 in | Wt 158.0 lb

## 2014-07-26 DIAGNOSIS — J019 Acute sinusitis, unspecified: Secondary | ICD-10-CM

## 2014-07-26 DIAGNOSIS — J309 Allergic rhinitis, unspecified: Secondary | ICD-10-CM | POA: Diagnosis not present

## 2014-07-26 MED ORDER — FLUTICASONE PROPIONATE 50 MCG/ACT NA SUSP: 2.0000 | Freq: Every day | NASAL | Status: DC

## 2014-07-26 MED ORDER — AMOXICILLIN-POT CLAVULANATE 875-125 MG PO TABS: 1.0000 | ORAL_TABLET | Freq: Two times a day (BID) | ORAL | Status: DC

## 2014-07-26 NOTE — Progress Notes (Signed)
   Subjective:    Patient ID: Joan Yang, female    DOB: 06/15/1955, 59 y.o.   MRN: [id number]  HPI Patient comes in today for possible sinus infection versus allergies. She denies any fever home but says she really hasn't taken her temperature. She's had symptoms for over a week. She fell and she started to get worse over the weekend. + post nasal dip and nasal congestion and cough.  ST in the AM.  No ear pain.  No facial pain but pressure in the ears.  No tching in the throat and ears.  Took some benadryl.   Review of Systems     Objective:   Physical Exam  Constitutional: She is oriented to person, place, and time. She appears well-developed and well-nourished.  HENT:  Head: Normocephalic and atraumatic.  Right Ear: External ear normal.  Left Ear: External ear normal.  Nose: Nose normal.  Mouth/Throat: Oropharynx is clear and moist.  TMs and canals are clear.   Eyes: Conjunctivae and EOM are normal. Pupils are equal, round, and reactive to light.  Neck: Neck supple. No thyromegaly present.  Cardiovascular: Normal rate, regular rhythm and normal heart sounds.   Pulmonary/Chest: Effort normal and breath sounds normal. She has no wheezes.  Lymphadenopathy:    She has no cervical adenopathy.  Neurological: She is alert and oriented to person, place, and time.  Skin: Skin is warm and dry.  Psychiatric: She has a normal mood and affect.          Assessment & Plan:  Allergic rhinitis/acute sinusitis-recommend at least some trial treatment with allergy regimen. Recommend start with a nasal steroid spray. Give it a couple days to start working. Can add an oral antihistamine if needed. If she's not feeling some better by the end of the week or if she suddenly feels worse or develops a fever than okay to fill the antibiotic. [place] and will be gone for a week. Please call if not better

## 2014-07-26 NOTE — Patient Instructions (Signed)
Can also add allegra or claritin daily if needed.

## 2014-11-11 ENCOUNTER — Other Ambulatory Visit: Payer: Self-pay | Admitting: Family Medicine

## 2014-12-13 ENCOUNTER — Other Ambulatory Visit: Payer: Self-pay | Admitting: Family Medicine

## 2014-12-13 DIAGNOSIS — Z1231 Encounter for screening mammogram for malignant neoplasm of breast: Secondary | ICD-10-CM

## 2014-12-14 ENCOUNTER — Ambulatory Visit (INDEPENDENT_AMBULATORY_CARE_PROVIDER_SITE_OTHER): Payer: BLUE CROSS/BLUE SHIELD | Admitting: Family Medicine

## 2014-12-14 ENCOUNTER — Other Ambulatory Visit: Payer: Self-pay | Admitting: Family Medicine

## 2014-12-14 ENCOUNTER — Encounter: Payer: Self-pay | Admitting: Family Medicine

## 2014-12-14 VITALS — BP 114/66 | HR 62 | Ht 64.0 in | Wt 156.0 lb

## 2014-12-14 DIAGNOSIS — Z1159 Encounter for screening for other viral diseases: Secondary | ICD-10-CM | POA: Diagnosis not present

## 2014-12-14 DIAGNOSIS — I10 Essential (primary) hypertension: Secondary | ICD-10-CM | POA: Diagnosis not present

## 2014-12-14 LAB — BASIC METABOLIC PANEL WITH GFR
BUN: 14 mg/dL (ref 7–25)
CHLORIDE: 102 mmol/L (ref 98–110)
CO2: 26 mmol/L (ref 20–31)
CREATININE: 0.94 mg/dL (ref 0.50–1.05)
Calcium: 9 mg/dL (ref 8.6–10.4)
GFR, EST NON AFRICAN AMERICAN: 67 mL/min (ref >=60)
GFR, Est African American: 77 mL/min (ref >=60)
Glucose, Bld: 70 mg/dL (ref 65–99)
POTASSIUM: 4 mmol/L (ref 3.5–5.3)
SODIUM: 140 mmol/L (ref 135–146)

## 2014-12-14 LAB — TSH: TSH: 0.182 u[IU]/mL — AB (ref 0.350–4.500)

## 2014-12-14 MED ORDER — TRIAMTERENE-HCTZ 37.5-25 MG PO TABS: 1.0000 | ORAL_TABLET | Freq: Every day | ORAL | Status: DC

## 2014-12-14 NOTE — Progress Notes (Signed)
   Subjective:    Patient ID: Joan Yang, female    DOB: March 02, 1956, 59 y.o.   MRN: [id number]  HPI Hypertension- Pt denies chest pain, SOB, dizziness, or heart palpitations.  Taking meds as directed w/o problems.  Denies medication side effects.     Review of Systems     Objective:   Physical Exam  Constitutional: She is oriented to person, place, and time. She appears well-developed and well-nourished.  HENT:  Head: Normocephalic and atraumatic.  Cardiovascular: Normal rate, regular rhythm and normal heart sounds.   Pulmonary/Chest: Effort normal and breath sounds normal.  Neurological: She is alert and oriented to person, place, and time.  Skin: Skin is warm and dry.  Psychiatric: She has a normal mood and affect. Her behavior is normal.          Assessment & Plan:  Hypertension-well-controlled. Continue current regimen. Due for BMP. Follow-up in 6 months. New perception sent to the pharmacy.  Declined flu vaccine today.

## 2014-12-15 LAB — HEPATITIS C ANTIBODY: HCV AB: NEGATIVE

## 2014-12-16 LAB — T4, FREE: FREE T4: 1.25 ng/dL (ref 0.80–1.80)

## 2014-12-16 LAB — T3, FREE: T3, Free: 3 pg/mL (ref 2.3–4.2)

## 2015-01-13 ENCOUNTER — Ambulatory Visit (INDEPENDENT_AMBULATORY_CARE_PROVIDER_SITE_OTHER): Payer: BLUE CROSS/BLUE SHIELD

## 2015-01-13 DIAGNOSIS — Z1231 Encounter for screening mammogram for malignant neoplasm of breast: Secondary | ICD-10-CM

## 2015-06-16 ENCOUNTER — Ambulatory Visit: Payer: BLUE CROSS/BLUE SHIELD | Admitting: Family Medicine

## 2015-06-16 ENCOUNTER — Encounter: Payer: Self-pay | Admitting: Family Medicine

## 2015-06-16 ENCOUNTER — Ambulatory Visit (INDEPENDENT_AMBULATORY_CARE_PROVIDER_SITE_OTHER): Payer: BLUE CROSS/BLUE SHIELD | Admitting: Family Medicine

## 2015-06-16 VITALS — BP 117/55 | HR 66 | Wt 154.0 lb

## 2015-06-16 DIAGNOSIS — I10 Essential (primary) hypertension: Secondary | ICD-10-CM | POA: Diagnosis not present

## 2015-06-16 DIAGNOSIS — M459 Ankylosing spondylitis of unspecified sites in spine: Secondary | ICD-10-CM | POA: Diagnosis not present

## 2015-06-16 DIAGNOSIS — M899 Disorder of bone, unspecified: Secondary | ICD-10-CM

## 2015-06-16 DIAGNOSIS — E042 Nontoxic multinodular goiter: Secondary | ICD-10-CM

## 2015-06-16 DIAGNOSIS — M949 Disorder of cartilage, unspecified: Secondary | ICD-10-CM

## 2015-06-16 DIAGNOSIS — E0590 Thyrotoxicosis, unspecified without thyrotoxic crisis or storm: Secondary | ICD-10-CM

## 2015-06-16 MED ORDER — TRIAMTERENE-HCTZ 37.5-25 MG PO TABS: 1.0000 | ORAL_TABLET | Freq: Every day | ORAL | Status: DC

## 2015-06-16 MED ORDER — CONJ ESTROG-MEDROXYPROGEST ACE 0.45-1.5 MG PO TABS: 1.0000 | ORAL_TABLET | Freq: Every day | ORAL | Status: DC

## 2015-06-16 NOTE — Progress Notes (Signed)
   Subjective:    Patient ID: Joan Yang, female    DOB: [birth year], 60 y.o.   MRN: [id number]  HPI Hypertension- Pt denies chest pain, SOB, dizziness, or heart palpitations.  Taking meds as directed w/o problems.  Denies medication side effects.  Occ sob when walks multiples stairs.  Some exercise.    AK - doing well on Humira.    Subclinical hyperthyroidism-she would like to have her thyroid checked again.   Review of Systems     Objective:   Physical Exam  Constitutional: She is oriented to person, place, and time. She appears well-developed and well-nourished.  HENT:  Head: Normocephalic and atraumatic.  Cardiovascular: Normal rate, regular rhythm and normal heart sounds.   Pulmonary/Chest: Effort normal and breath sounds normal.  Neurological: She is alert and oriented to person, place, and time.  Skin: Skin is warm and dry.  Psychiatric: She has a normal mood and affect. Her behavior is normal.        Assessment & Plan:  HTN - well controlled. Continue current regimen. Due for CMP and lipid panel. Follow-up in 6 months. Recheck thyroid level.  AK- stable on humira.    Subclinical hyperthyroidism-recheck TSH.  History of low vitamin D-recheck vitamin D level today.  Discussed need for colon cancer screening. Reviewed: Cologuard  as an option.

## 2015-08-29 ENCOUNTER — Other Ambulatory Visit: Payer: Self-pay | Admitting: Family Medicine

## 2015-08-29 DIAGNOSIS — I10 Essential (primary) hypertension: Secondary | ICD-10-CM | POA: Diagnosis not present

## 2015-08-29 DIAGNOSIS — E0590 Thyrotoxicosis, unspecified without thyrotoxic crisis or storm: Secondary | ICD-10-CM | POA: Diagnosis not present

## 2015-08-29 DIAGNOSIS — E042 Nontoxic multinodular goiter: Secondary | ICD-10-CM | POA: Diagnosis not present

## 2015-08-29 DIAGNOSIS — Z1159 Encounter for screening for other viral diseases: Secondary | ICD-10-CM | POA: Diagnosis not present

## 2015-08-30 LAB — COMPLETE METABOLIC PANEL WITH GFR
ALBUMIN: 4.1 g/dL (ref 3.6–5.1)
ALT: 14 U/L (ref 6–29)
AST: 23 U/L (ref 10–35)
Alkaline Phosphatase: 47 U/L (ref 33–130)
BUN: 17 mg/dL (ref 7–25)
CO2: 30 mmol/L (ref 20–31)
Calcium: 9.2 mg/dL (ref 8.6–10.4)
Chloride: 102 mmol/L (ref 98–110)
Creat: 0.84 mg/dL (ref 0.50–1.05)
GFR, EST NON AFRICAN AMERICAN: 76 mL/min (ref >=60)
GFR, Est African American: 88 mL/min (ref >=60)
GLUCOSE: 77 mg/dL (ref 65–99)
POTASSIUM: 3.6 mmol/L (ref 3.5–5.3)
SODIUM: 139 mmol/L (ref 135–146)
TOTAL PROTEIN: 7.3 g/dL (ref 6.1–8.1)
Total Bilirubin: 1 mg/dL (ref 0.2–1.2)

## 2015-08-30 LAB — LIPID PANEL
CHOLESTEROL: 233 mg/dL — AB (ref 125–200)
HDL: 99 mg/dL (ref >=46)
LDL Cholesterol: 120 mg/dL (ref <130)
TRIGLYCERIDES: 68 mg/dL (ref <150)
Total CHOL/HDL Ratio: 2.4 Ratio (ref <=5.0)
VLDL: 14 mg/dL (ref <30)

## 2015-08-30 LAB — TSH: TSH: 0.3 mIU/L — ABNORMAL LOW

## 2015-08-30 LAB — VITAMIN D 25 HYDROXY (VIT D DEFICIENCY, FRACTURES): Vit D, 25-Hydroxy: 36 ng/mL (ref 30–100)

## 2015-08-31 LAB — T3, FREE

## 2015-08-31 LAB — T4, FREE

## 2015-09-01 ENCOUNTER — Telehealth: Payer: Self-pay | Admitting: *Deleted

## 2015-09-01 DIAGNOSIS — E042 Nontoxic multinodular goiter: Secondary | ICD-10-CM

## 2015-09-01 DIAGNOSIS — E0590 Thyrotoxicosis, unspecified without thyrotoxic crisis or storm: Secondary | ICD-10-CM

## 2015-09-01 NOTE — Telephone Encounter (Signed)
[facility] called and states they are not able to add on T3 and T4 due to there not being enough specimen. Pt notified that she will need to go to the lab and have those test drawn.labs faxed

## 2015-09-22 DIAGNOSIS — M4690 Unspecified inflammatory spondylopathy, site unspecified: Secondary | ICD-10-CM | POA: Diagnosis not present

## 2015-09-22 DIAGNOSIS — Z79899 Other long term (current) drug therapy: Secondary | ICD-10-CM | POA: Diagnosis not present

## 2015-09-22 DIAGNOSIS — Z8669 Personal history of other diseases of the nervous system and sense organs: Secondary | ICD-10-CM | POA: Diagnosis not present

## 2015-09-22 DIAGNOSIS — Z87891 Personal history of nicotine dependence: Secondary | ICD-10-CM | POA: Diagnosis not present

## 2015-10-10 ENCOUNTER — Other Ambulatory Visit: Payer: Self-pay | Admitting: *Deleted

## 2015-10-10 MED ORDER — CONJ ESTROG-MEDROXYPROGEST ACE 0.45-1.5 MG PO TABS: 1.0000 | ORAL_TABLET | Freq: Every day | ORAL | Status: DC

## 2015-10-14 DIAGNOSIS — M450 Ankylosing spondylitis of multiple sites in spine: Secondary | ICD-10-CM | POA: Diagnosis not present

## 2015-10-14 DIAGNOSIS — H25012 Cortical age-related cataract, left eye: Secondary | ICD-10-CM | POA: Diagnosis not present

## 2015-10-14 DIAGNOSIS — H25011 Cortical age-related cataract, right eye: Secondary | ICD-10-CM | POA: Diagnosis not present

## 2015-11-10 DIAGNOSIS — H43811 Vitreous degeneration, right eye: Secondary | ICD-10-CM | POA: Diagnosis not present

## 2015-11-10 DIAGNOSIS — H25011 Cortical age-related cataract, right eye: Secondary | ICD-10-CM | POA: Diagnosis not present

## 2015-11-10 DIAGNOSIS — H25012 Cortical age-related cataract, left eye: Secondary | ICD-10-CM | POA: Diagnosis not present

## 2015-12-15 ENCOUNTER — Encounter: Payer: BLUE CROSS/BLUE SHIELD | Admitting: Family Medicine

## 2015-12-21 ENCOUNTER — Other Ambulatory Visit: Payer: Self-pay | Admitting: Family Medicine

## 2015-12-21 DIAGNOSIS — Z1231 Encounter for screening mammogram for malignant neoplasm of breast: Secondary | ICD-10-CM

## 2015-12-29 ENCOUNTER — Encounter: Payer: Self-pay | Admitting: Family Medicine

## 2015-12-29 ENCOUNTER — Other Ambulatory Visit (HOSPITAL_COMMUNITY)
Admission: RE | Admit: 2015-12-29 | Discharge: 2015-12-29 | Disposition: A | Discharge status: Home / Self Care | Payer: BLUE CROSS/BLUE SHIELD | Source: Ambulatory Visit | Attending: Family Medicine | Admitting: Family Medicine

## 2015-12-29 ENCOUNTER — Ambulatory Visit (INDEPENDENT_AMBULATORY_CARE_PROVIDER_SITE_OTHER): Payer: BLUE CROSS/BLUE SHIELD | Admitting: Family Medicine

## 2015-12-29 VITALS — BP 127/57 | HR 71 | Ht 64.0 in | Wt 154.0 lb

## 2015-12-29 DIAGNOSIS — R946 Abnormal results of thyroid function studies: Secondary | ICD-10-CM | POA: Diagnosis not present

## 2015-12-29 DIAGNOSIS — R7989 Other specified abnormal findings of blood chemistry: Secondary | ICD-10-CM

## 2015-12-29 DIAGNOSIS — Z01419 Encounter for gynecological examination (general) (routine) without abnormal findings: Secondary | ICD-10-CM | POA: Insufficient documentation

## 2015-12-29 DIAGNOSIS — Z1151 Encounter for screening for human papillomavirus (HPV): Secondary | ICD-10-CM | POA: Insufficient documentation

## 2015-12-29 MED ORDER — TRIAMTERENE-HCTZ 37.5-25 MG PO TABS: 1.0000 | ORAL_TABLET | Freq: Every day | ORAL | 1 refills | Status: DC

## 2015-12-29 NOTE — Patient Instructions (Signed)
Keep up a regular exercise program and make sure you are eating a healthy diet Try to eat 4 servings of dairy a day, or if you are lactose intolerant take a calcium with vitamin D daily.  Your vaccines are up to date.   

## 2015-12-29 NOTE — Progress Notes (Signed)
Subjective:     Joan Yang is a 60 y.o. female and is here for a comprehensive physical exam. The patient reports no problems.  She does have a spot on the pad of her left first finger that she would like me to look at today. It's been there for a couple of weeks but is starting to get tender.  She usually works out very regularly but says for the last 2 weeks she has only worked out twice but plans on getting back on track. She feels like she eats a healthy diet. And she has no particular complaints today. No pelvic pain or abnormal discharge.  Social History   Social History  . Marital status: Married    Spouse name: N/A  . Number of children: N/A  . Years of education: N/A   Occupational History  . Not on file.   Social History Main Topics  . Smoking status: Former Games developermoker  . Smokeless tobacco: Not on file  . Alcohol use Not on file  . Drug use: Unknown  . Sexual activity: Not on file   Other Topics Concern  . Not on file   Social History Narrative  . No narrative on file   Health Maintenance  Topic Date Due  . COLONOSCOPY  04/24/2015  . PAP SMEAR  11/08/2015  . INFLUENZA VACCINE  11/22/2015  . ZOSTAVAX  12/26/2015  . HIV Screening  06/15/2020 (Originally 12/26/1970)  . MAMMOGRAM  01/12/2017  . TETANUS/TDAP  07/18/2018  . Hepatitis C Screening  Completed    The following portions of the patient's history were reviewed and updated as appropriate: allergies, current medications, past family history, past medical history, past social history, past surgical history and problem list.  Review of Systems A comprehensive review of systems was negative.   Objective:    BP (!) 127/57   Pulse 71   Ht 5\' 4"  (1.626 m)   Wt 154 lb (69.9 kg)   SpO2 100%   BMI 26.43 kg/m  General appearance: alert, cooperative and appears stated age Head: Normocephalic, without obvious abnormality, atraumatic Eyes: conj clear EOMi, pEERLA Ears: normal TM's and external ear canals both  ears Nose: Nares normal. Septum midline. Mucosa normal. No drainage or sinus tenderness. Throat: lips, mucosa, and tongue normal; teeth and gums normal Neck: no adenopathy, no carotid bruit, no JVD, supple, symmetrical, trachea midline and Thyroid borderline enlarged. Back: symmetric, no curvature. ROM normal. No CVA tenderness. Lungs: clear to auscultation bilaterally Breasts: normal appearance, no masses or tenderness Heart: regular rate and rhythm, S1, S2 normal, no murmur, click, rub or gallop Abdomen: soft, non-tender; bowel sounds normal; no masses,  no organomegaly Pelvic: cervix normal in appearance, external genitalia normal, no adnexal masses or tenderness, no cervical motion tenderness, rectovaginal septum normal, uterus normal size, shape, and consistency and vagina normal without discharge Extremities: extremities normal, atraumatic, no cyanosis or edema Pulses: 2+ and symmetric Skin: Skin color, texture, turgor normal. No rashes or lesions. He does have a small skin colored raised lesion with a brown central core on the pad of her left first finger. Lymph nodes: Cervical, supraclavicular, and axillary nodes normal. Neurologic: Alert and oriented X 3, normal strength and tone. Normal symmetric reflexes. Normal coordination and gait    Assessment:    Healthy female exam.      Plan:     See After Visit Summary for Counseling Recommendations   Keep up a regular exercise program and make sure you are eating  a healthy diet Try to eat 4 servings of dairy a day, or if you are lactose intolerant take a calcium with vitamin D daily.  Your vaccines are up to date.  Mammo already scheduled.  : Card form completed and will fax today.  She has what looks like a corn on the fingertip. She concern try a corn remover which is basically an acetic acid type product or we can do liquid nitrogen cryotherapy. She says she will opt to use the over-the-counter products first.  Abnormal thyroid  test-due to recheck labs today.

## 2015-12-30 LAB — THYROID PEROXIDASE ANTIBODY: Thyroperoxidase Ab SerPl-aCnc: 1 IU/mL (ref <9)

## 2015-12-30 LAB — T4, FREE: FREE T4: 1.4 ng/dL (ref 0.8–1.8)

## 2015-12-30 LAB — TSH: TSH: 0.24 mIU/L — ABNORMAL LOW

## 2015-12-30 LAB — T3, FREE: T3 FREE: 3 pg/mL (ref 2.3–4.2)

## 2016-01-04 LAB — CYTOLOGY - PAP

## 2016-01-27 ENCOUNTER — Ambulatory Visit: Payer: BLUE CROSS/BLUE SHIELD

## 2016-02-03 ENCOUNTER — Ambulatory Visit: Payer: BLUE CROSS/BLUE SHIELD

## 2016-03-07 ENCOUNTER — Ambulatory Visit (INDEPENDENT_AMBULATORY_CARE_PROVIDER_SITE_OTHER): Payer: BLUE CROSS/BLUE SHIELD

## 2016-03-07 DIAGNOSIS — Z1231 Encounter for screening mammogram for malignant neoplasm of breast: Secondary | ICD-10-CM | POA: Diagnosis not present

## 2016-03-07 DIAGNOSIS — L2089 Other atopic dermatitis: Secondary | ICD-10-CM | POA: Diagnosis not present

## 2016-03-27 DIAGNOSIS — Z79899 Other long term (current) drug therapy: Secondary | ICD-10-CM | POA: Diagnosis not present

## 2016-03-27 DIAGNOSIS — M4690 Unspecified inflammatory spondylopathy, site unspecified: Secondary | ICD-10-CM | POA: Diagnosis not present

## 2016-04-03 ENCOUNTER — Other Ambulatory Visit: Payer: Self-pay | Admitting: Family Medicine

## 2016-04-04 DIAGNOSIS — Z1212 Encounter for screening for malignant neoplasm of rectum: Secondary | ICD-10-CM | POA: Diagnosis not present

## 2016-04-04 DIAGNOSIS — Z1211 Encounter for screening for malignant neoplasm of colon: Secondary | ICD-10-CM | POA: Diagnosis not present

## 2016-04-11 LAB — COLOGUARD: Cologuard: NEGATIVE

## 2016-04-12 ENCOUNTER — Telehealth: Payer: Self-pay | Admitting: Family Medicine

## 2016-04-12 NOTE — Telephone Encounter (Signed)
Call patient: Color guard results is negative which is great. Recommend repeat colon cancer screening in 3 years. Nani Gasseratherine Metheney, MD

## 2016-04-17 NOTE — Telephone Encounter (Signed)
Left VM for Pt to return clinic call regarding results, callback information provided. 

## 2016-04-24 NOTE — Telephone Encounter (Signed)
Pt returned clinic call, advised of results and recommendation. Verbalized understanding. No further questions.

## 2016-05-10 ENCOUNTER — Encounter: Payer: Self-pay | Admitting: Family Medicine

## 2016-06-18 ENCOUNTER — Telehealth: Payer: BLUE CROSS/BLUE SHIELD | Admitting: Nurse Practitioner

## 2016-06-18 DIAGNOSIS — R059 Cough, unspecified: Secondary | ICD-10-CM

## 2016-06-18 DIAGNOSIS — R05 Cough: Secondary | ICD-10-CM

## 2016-06-18 MED ORDER — BENZONATATE 100 MG PO CAPS: 100.0000 mg | ORAL_CAPSULE | Freq: Three times a day (TID) | ORAL | 0 refills | Status: DC | PRN

## 2016-06-18 MED ORDER — DOXYCYCLINE HYCLATE 100 MG PO TABS: 100.0000 mg | ORAL_TABLET | Freq: Two times a day (BID) | ORAL | 0 refills | Status: DC

## 2016-06-18 NOTE — Progress Notes (Signed)
We are sorry that you are not feeling well.  Here is how we plan to help!  Based on what you have shared with me it looks like you have upper respiratory tract inflammation that has resulted in a significant cough.  Inflammation and infection in the upper respiratory tract is commonly called bronchitis and has four common causes:  Allergies, Viral Infections, Acid Reflux and Bacterial Infections.  Allergies, viruses and acid reflux are treated by controlling symptoms or eliminating the cause. An example might be a cough caused by taking certain blood pressure medications. You stop the cough by changing the medication. Another example might be a cough caused by acid reflux. Controlling the reflux helps control the cough.  Based on your presentation I believe you most likely have A cough due to bacteria.  When patients have a fever and a productive cough with a change in color or increased sputum production, we are concerned about bacterial bronchitis.  If left untreated it can progress to pneumonia.  If your symptoms do not improve with your treatment plan it is important that you contact your provider.   I hve prescribed Doxycycline 100 mg twice a day for 7 days     In addition you may use A prescription cough medication called Tessalon Perles 100mg. You may take 1-2 capsules every 8 hours as needed for your cough.    USE OF BRONCHODILATOR ("RESCUE") INHALERS: There is a risk from using your bronchodilator too frequently.  The risk is that over-reliance on a medication which only relaxes the muscles surrounding the breathing tubes can reduce the effectiveness of medications prescribed to reduce swelling and congestion of the tubes themselves.  Although you feel brief relief from the bronchodilator inhaler, your asthma may actually be worsening with the tubes becoming more swollen and filled with mucus.  This can delay other crucial treatments, such as oral steroid medications. If you need to use a  bronchodilator inhaler daily, several times per day, you should discuss this with your provider.  There are probably better treatments that could be used to keep your asthma under control.     HOME CARE . Only take medications as instructed by your medical team. . Complete the entire course of an antibiotic. . Drink plenty of fluids and get plenty of rest. . Avoid close contacts especially the very young and the elderly . Cover your mouth if you cough or cough into your sleeve. . Always remember to wash your hands . A steam or ultrasonic humidifier can help congestion.   GET HELP RIGHT AWAY IF: . You develop worsening fever. . You become short of breath . You cough up blood. . Your symptoms persist after you have completed your treatment plan MAKE SURE YOU   Understand these instructions.  Will watch your condition.  Will get help right away if you are not doing well or get worse.  Your e-visit answers were reviewed by a board certified advanced clinical practitioner to complete your personal care plan.  Depending on the condition, your plan could have included both over the counter or prescription medications. If there is a problem please reply  once you have received a response from your provider. Your safety is important to us.  If you have drug allergies check your prescription carefully.    You can use MyChart to ask questions about today's visit, request a non-urgent call back, or ask for a work or school excuse for 24 hours related to this e-Visit. If   it has been greater than 24 hours you will need to follow up with your provider, or enter a new e-Visit to address those concerns. You will get an e-mail in the next two days asking about your experience.  I hope that your e-visit has been valuable and will speed your recovery. Thank you for using e-visits.   

## 2016-06-28 ENCOUNTER — Encounter: Payer: Self-pay | Admitting: Family Medicine

## 2016-06-28 ENCOUNTER — Ambulatory Visit (INDEPENDENT_AMBULATORY_CARE_PROVIDER_SITE_OTHER): Payer: BLUE CROSS/BLUE SHIELD | Admitting: Family Medicine

## 2016-06-28 VITALS — BP 115/56 | HR 65 | Ht 64.0 in | Wt 153.0 lb

## 2016-06-28 DIAGNOSIS — R05 Cough: Secondary | ICD-10-CM

## 2016-06-28 DIAGNOSIS — I10 Essential (primary) hypertension: Secondary | ICD-10-CM | POA: Diagnosis not present

## 2016-06-28 DIAGNOSIS — Z79890 Hormone replacement therapy (postmenopausal): Secondary | ICD-10-CM | POA: Diagnosis not present

## 2016-06-28 DIAGNOSIS — R058 Other specified cough: Secondary | ICD-10-CM

## 2016-06-28 MED ORDER — TRIAMTERENE-HCTZ 37.5-25 MG PO TABS: 1.0000 | ORAL_TABLET | Freq: Every day | ORAL | 1 refills | Status: DC

## 2016-06-28 MED ORDER — CONJ ESTROG-MEDROXYPROGEST ACE 0.45-1.5 MG PO TABS: 1.0000 | ORAL_TABLET | Freq: Every day | ORAL | 4 refills | Status: DC

## 2016-06-28 NOTE — Progress Notes (Signed)
Subjective:    CC: HTN  HPI: Hypertension- Pt denies chest pain, SOB, dizziness, or heart palpitations.  Taking meds as directed w/o problems.  Denies medication side effects.    HRT -   Doing well. Tried to stop her medication but had excessive sweats and hot flashes so restarted it. She didn't taper it.    Also had a slight cough. She didn't eat visit about 2 weeks ago and was treated with doxycycline and Tessalon Perles. She said the [employer] Perles really didn't help so she switched back to just over-the-counter cough lozenges. She does feel tremendously better but still has a slight cough and would like me to check her chest today.  Past medical history, Surgical history, Family history not pertinant except as noted below, Social history, Allergies, and medications have been entered into the medical record, reviewed, and corrections made.   Review of Systems: No fevers, chills, night sweats, weight loss, chest pain, or shortness of breath.   Objective:    General: Well Developed, well nourished, and in no acute distress.  Neuro: Alert and oriented x3, extra-ocular muscles intact, sensation grossly intact.  HEENT: Normocephalic, atraumatic  Skin: Warm and dry, no rashes. Cardiac: Regular rate and rhythm, no murmurs rubs or gallops, no lower extremity edema.  Respiratory: Clear to auscultation bilaterally. Not using accessory muscles, speaking in full sentences.   Impression and Recommendations:    HTN - Well controlled. Continue current regimen. Follow up in  6 monthsl Due for CMP and lipid panel.  HRT - her mammo is up to date.  Refill med. Discussed trying to slowly taper her hormone therapy. Encouraged her try taking her Prempro every other day for about 3-6 months and if doing well at that point been trying to drop down to every third day.  Postinfectious cough-gave reassurance. Lungs are clear today and she is gradually improving.

## 2016-08-28 ENCOUNTER — Telehealth: Payer: Self-pay | Admitting: Osteopathic Medicine

## 2016-08-28 ENCOUNTER — Ambulatory Visit (INDEPENDENT_AMBULATORY_CARE_PROVIDER_SITE_OTHER): Payer: BLUE CROSS/BLUE SHIELD | Admitting: Osteopathic Medicine

## 2016-08-28 ENCOUNTER — Encounter: Payer: Self-pay | Admitting: Osteopathic Medicine

## 2016-08-28 VITALS — BP 133/72 | HR 68 | Ht 64.0 in | Wt 156.0 lb

## 2016-08-28 DIAGNOSIS — I10 Essential (primary) hypertension: Secondary | ICD-10-CM | POA: Diagnosis not present

## 2016-08-28 DIAGNOSIS — K120 Recurrent oral aphthae: Secondary | ICD-10-CM | POA: Diagnosis not present

## 2016-08-28 LAB — COMPLETE METABOLIC PANEL WITH GFR
ALBUMIN: 3.9 g/dL (ref 3.6–5.1)
ALT: 13 U/L (ref 6–29)
AST: 23 U/L (ref 10–35)
Alkaline Phosphatase: 44 U/L (ref 33–130)
BILIRUBIN TOTAL: 0.7 mg/dL (ref 0.2–1.2)
BUN: 18 mg/dL (ref 7–25)
CHLORIDE: 106 mmol/L (ref 98–110)
CO2: 24 mmol/L (ref 20–31)
CREATININE: 1.01 mg/dL — AB (ref 0.50–0.99)
Calcium: 9 mg/dL (ref 8.6–10.4)
GFR, EST AFRICAN AMERICAN: 70 mL/min (ref >=60)
GFR, Est Non African American: 61 mL/min (ref >=60)
Glucose, Bld: 72 mg/dL (ref 65–99)
Potassium: 3.9 mmol/L (ref 3.5–5.3)
Sodium: 142 mmol/L (ref 135–146)
TOTAL PROTEIN: 7.2 g/dL (ref 6.1–8.1)

## 2016-08-28 LAB — LIPID PANEL W/REFLEX DIRECT LDL
CHOLESTEROL: 222 mg/dL — AB (ref <200)
HDL: 95 mg/dL (ref >50)
LDL-Cholesterol: 112 mg/dL — ABNORMAL HIGH
Non-HDL Cholesterol (Calc): 127 mg/dL (ref <130)
TRIGLYCERIDES: 61 mg/dL (ref <150)
Total CHOL/HDL Ratio: 2.3 Ratio (ref <5.0)

## 2016-08-28 MED ORDER — MAGIC MOUTHWASH: ORAL | 1 refills | Status: DC

## 2016-08-28 MED ORDER — MAGIC MOUTHWASH: 5.0000 mL | Freq: Three times a day (TID) | ORAL | 1 refills | Status: DC

## 2016-08-28 NOTE — Patient Instructions (Signed)
Refilled magic mouthwash, If symptoms persist, please schedule follow-up with Dr. Linford ArnoldMetheney

## 2016-08-28 NOTE — Progress Notes (Signed)
HPI: Joan Yang is a 61 y.o. female  who [facility], 08/[facility],  for chief complaint of:  Chief Complaint  Patient presents with  . Mouth Lesions     . Context: History of similar sores in the past, resolved with Magic mouthwash . Location: Right cheek, right lateral tongue . Quality: No serious pain/drainage, mild ulceration with discomfort . Duration: 1-2 weeks  Dermal ulcer <1% postmarketing reported adverse events of Humira.     Past medical history, surgical history, social history and family history reviewed.  Patient Active Problem List   Diagnosis Date Noted  . Hypokalemia 08/05/2012  . Subclinical hyperthyroidism 08/05/2012  . Multinodular goiter (nontoxic) 06/13/2012  . LEUKOPLAKIA, ORAL MUCOSA 10/02/2007  . Ankylosing spondylitis (HCC) 01/28/2007  . Disorder of bone and cartilage 12/09/2006  . POSTMENOPAUSAL STATUS 06/07/2006  . HYPERTENSION, BENIGN SYSTEMIC 01/29/2006  . ATOPIC DERMATITIS 01/29/2006    Current medication list and allergy/intolerance information reviewed.   Current Outpatient Prescriptions on File Prior to Visit  Medication Sig Dispense Refill  . adalimumab (HUMIRA PEN) 40 MG/0.8ML injection Inject 40 mg into the skin every 14 (fourteen) days.      Marland Kitchen. estrogen, conjugated,-medroxyprogesterone (PREMPRO) 0.45-1.5 MG tablet Take 1 tablet by mouth daily. 54 tablet 4  . fluticasone (FLONASE) 50 MCG/ACT nasal spray Place 2 sprays into both nostrils daily. 16 g 6  . triamterene-hydrochlorothiazide (MAXZIDE-25) 37.5-25 MG tablet Take 1 tablet by mouth daily. 90 tablet 1   No current facility-administered medications on file prior to visit.    No Known Allergies    Review of Systems:  Constitutional: No recent illness, no fever/chills  HEENT: No  headache, no jaw pain, mouth complaint as per HPI   Exam:  BP 133/72   Pulse 68   Ht 5\' 4"  (1.626 m)   Wt 156 lb (70.8 kg)   BMI 26.78 kg/m    Constitutional: VS see above. General Appearance: alert, well-developed, well-nourished, NAD  Eyes: Normal lids and conjunctive, non-icteric sclera  Ears, Nose, Mouth, Throat: MMM, Normal external inspection ears/nares/mouth/lips/gums. Small healing canker sore right internal cheek, some irritation to the lateral right side of the tongue, no abnormality visible on left cheek  Neck: No masses, trachea midline.   Respiratory: Normal respiratory effort.     ASSESSMENT/PLAN:   Canker sores oral - Plan: magic mouthwash SOLN    Patient Instructions  Refilled magic mouthwash, If symptoms persist, please schedule follow-up with Dr. Linford ArnoldMetheney     Follow-up plan: Return if symptoms worsen or fail to improve.  Visit summary with medication list and pertinent instructions was printed for patient to review, alert us if any changes needed. All questions at time of visit were answered - patient instructed to contact office with any additional concerns. ER/RTC precautions were reviewed with the patient and understanding verbalized.

## 2016-08-28 NOTE — Telephone Encounter (Signed)
Diphenhydramine 12.5 mg/5 mL 240 mL Hydrocortisone 60 mg Nystatin powder 6 million units Tetracycline 1.5 g Swish and spit 5 mL QID

## 2016-08-28 NOTE — Telephone Encounter (Signed)
Rx reprinted and faxed 

## 2016-08-30 ENCOUNTER — Ambulatory Visit: Payer: Self-pay | Admitting: Family Medicine

## 2016-09-06 DIAGNOSIS — R5383 Other fatigue: Secondary | ICD-10-CM | POA: Diagnosis not present

## 2016-09-06 DIAGNOSIS — M4690 Unspecified inflammatory spondylopathy, site unspecified: Secondary | ICD-10-CM | POA: Diagnosis not present

## 2016-09-06 DIAGNOSIS — Z8669 Personal history of other diseases of the nervous system and sense organs: Secondary | ICD-10-CM | POA: Diagnosis not present

## 2016-09-06 DIAGNOSIS — Z87891 Personal history of nicotine dependence: Secondary | ICD-10-CM | POA: Diagnosis not present

## 2016-09-06 DIAGNOSIS — M456 Ankylosing spondylitis lumbar region: Secondary | ICD-10-CM | POA: Diagnosis not present

## 2016-09-06 DIAGNOSIS — Z79899 Other long term (current) drug therapy: Secondary | ICD-10-CM | POA: Diagnosis not present

## 2016-12-15 ENCOUNTER — Other Ambulatory Visit: Payer: Self-pay | Admitting: Family Medicine

## 2016-12-27 ENCOUNTER — Ambulatory Visit (INDEPENDENT_AMBULATORY_CARE_PROVIDER_SITE_OTHER): Payer: BLUE CROSS/BLUE SHIELD | Admitting: Family Medicine

## 2016-12-27 VITALS — BP 124/60 | HR 58 | Ht 64.0 in | Wt 155.0 lb

## 2016-12-27 DIAGNOSIS — Z79890 Hormone replacement therapy (postmenopausal): Secondary | ICD-10-CM | POA: Diagnosis not present

## 2016-12-27 DIAGNOSIS — R0683 Snoring: Secondary | ICD-10-CM | POA: Diagnosis not present

## 2016-12-27 DIAGNOSIS — I10 Essential (primary) hypertension: Secondary | ICD-10-CM | POA: Diagnosis not present

## 2016-12-27 LAB — BASIC METABOLIC PANEL WITH GFR
BUN/Creatinine Ratio: 16 (calc) (ref 6–22)
BUN: 16 mg/dL (ref 7–25)
CALCIUM: 9.2 mg/dL (ref 8.6–10.4)
CHLORIDE: 102 mmol/L (ref 98–110)
CO2: 29 mmol/L (ref 20–32)
CREATININE: 1 mg/dL — AB (ref 0.50–0.99)
GFR, Est African American: 70 mL/min/{1.73_m2} (ref >=60)
GFR, Est Non African American: 61 mL/min/{1.73_m2} (ref >=60)
GLUCOSE: 79 mg/dL (ref 65–99)
Potassium: 3.9 mmol/L (ref 3.5–5.3)
Sodium: 137 mmol/L (ref 135–146)

## 2016-12-27 NOTE — Progress Notes (Signed)
Subjective:    CC: VP, HRT  HPI: Hypertension- Pt denies chest pain, SOB, dizziness, or heart palpitations.  Taking meds as directed w/o problems.  Denies medication side effects.    HRT - she was unable to decrease her hormones down to every other day. She will just feel too hot. She wants to waintil the wintertime when the days are cooler and try again.  Snoring - she is concerned about her snoring. She says it is loud and you can hear it through a door. But she denies any apneic events. She denies waking up with headaches or feeling overly tired or sedated the next day. She doesn't think she has sleep apnea snoring does run in her family.she wants to know if she could try the Breathe Right strips.  Past medical history, Surgical history, Family history not pertinant except as noted below, Social history, Allergies, and medications have been entered into the medical record, reviewed, and corrections made.   Review of Systems: No fevers, chills, night sweats, weight loss, chest pain, or shortness of breath.   Objective:    General: Well Developed, well nourished, and in no acute distress.  Neuro: Alert and oriented x3, extra-ocular muscles intact, sensation grossly intact.  HEENT: Normocephalic, atraumatic  Skin: Warm and dry, no rashes. Cardiac: Regular rate and rhythm, no murmurs rubs or gallops, no lower extremity edema.  Respiratory: Clear to auscultation bilaterally. Not using accessory muscles, speaking in full sentences.   Impression and Recommendations:    HTN  - Well controlled. Continue current regimen. Follow up in  6 months. Due for BMP.    HRT - she concerned retry 2 days on and 1 day off alternating and if she does well with that then switching to every other day. Encouraged her to try again this winter.  Snoring - Ok to try the nasal strips. Can consider oral appliance. Some people have surgery. Consider more formal eval for apnea.     Declined flu vaccine.

## 2017-01-29 DIAGNOSIS — I10 Essential (primary) hypertension: Secondary | ICD-10-CM | POA: Diagnosis not present

## 2017-01-29 DIAGNOSIS — H25013 Cortical age-related cataract, bilateral: Secondary | ICD-10-CM | POA: Diagnosis not present

## 2017-01-29 DIAGNOSIS — H25012 Cortical age-related cataract, left eye: Secondary | ICD-10-CM | POA: Diagnosis not present

## 2017-01-29 DIAGNOSIS — H25011 Cortical age-related cataract, right eye: Secondary | ICD-10-CM | POA: Diagnosis not present

## 2017-02-06 DIAGNOSIS — H25011 Cortical age-related cataract, right eye: Secondary | ICD-10-CM | POA: Diagnosis not present

## 2017-02-22 DIAGNOSIS — H25011 Cortical age-related cataract, right eye: Secondary | ICD-10-CM | POA: Insufficient documentation

## 2017-02-25 DIAGNOSIS — H20042 Secondary noninfectious iridocyclitis, left eye: Secondary | ICD-10-CM | POA: Diagnosis not present

## 2017-02-26 DIAGNOSIS — I10 Essential (primary) hypertension: Secondary | ICD-10-CM | POA: Diagnosis not present

## 2017-02-26 DIAGNOSIS — H25011 Cortical age-related cataract, right eye: Secondary | ICD-10-CM | POA: Diagnosis not present

## 2017-02-26 DIAGNOSIS — M450 Ankylosing spondylitis of multiple sites in spine: Secondary | ICD-10-CM | POA: Diagnosis not present

## 2017-04-03 DIAGNOSIS — Z79899 Other long term (current) drug therapy: Secondary | ICD-10-CM | POA: Diagnosis not present

## 2017-04-03 DIAGNOSIS — Z8669 Personal history of other diseases of the nervous system and sense organs: Secondary | ICD-10-CM | POA: Diagnosis not present

## 2017-04-03 DIAGNOSIS — M4690 Unspecified inflammatory spondylopathy, site unspecified: Secondary | ICD-10-CM | POA: Diagnosis not present

## 2017-04-11 DIAGNOSIS — L2089 Other atopic dermatitis: Secondary | ICD-10-CM | POA: Diagnosis not present

## 2017-06-10 ENCOUNTER — Other Ambulatory Visit: Payer: Self-pay | Admitting: Family Medicine

## 2017-06-27 ENCOUNTER — Encounter: Payer: Self-pay | Admitting: Family Medicine

## 2017-06-27 ENCOUNTER — Ambulatory Visit (INDEPENDENT_AMBULATORY_CARE_PROVIDER_SITE_OTHER): Payer: BLUE CROSS/BLUE SHIELD | Admitting: Family Medicine

## 2017-06-27 VITALS — BP 122/59 | HR 56 | Ht 64.0 in | Wt 162.0 lb

## 2017-06-27 DIAGNOSIS — I10 Essential (primary) hypertension: Secondary | ICD-10-CM

## 2017-06-27 DIAGNOSIS — E0590 Thyrotoxicosis, unspecified without thyrotoxic crisis or storm: Secondary | ICD-10-CM | POA: Diagnosis not present

## 2017-06-27 DIAGNOSIS — Z13220 Encounter for screening for lipoid disorders: Secondary | ICD-10-CM | POA: Diagnosis not present

## 2017-06-27 MED ORDER — CONJ ESTROG-MEDROXYPROGEST ACE 0.45-1.5 MG PO TABS: 1.0000 | ORAL_TABLET | Freq: Every day | ORAL | 4 refills | Status: DC

## 2017-06-27 NOTE — Progress Notes (Signed)
Subjective:    CC: BP check   HPI:  Hypertension- Pt denies chest pain, SOB, dizziness, or heart palpitations.  Taking meds as directed w/o problems.  Denies medication side effects.    F/U HRT -says she is tried hard to try to reduce her hormone therapy.  She is tried taking it every other day and the hot flashes just come back.  She wants to continue with her current regimen for now.   Ankylosing spondylitis-she has been on Humira for about 10 years when she was first diagnosed.  She says it does a great job in controlling her inflammation and pain but has just felt much more fatigued more recently.  She denies any sleep problems.  No fevers, abnormal rashes etc.  No persistent cough or blood in the stool.  Past medical history, Surgical history, Family history not pertinant except as noted below, Social history, Allergies, and medications have been entered into the medical record, reviewed, and corrections made.   Review of Systems: No fevers, chills, night sweats, weight loss, chest pain, or shortness of breath.   Objective:    General: Well Developed, well nourished, and in no acute distress.  Neuro: Alert and oriented x3, extra-ocular muscles intact, sensation grossly intact.  HEENT: Normocephalic, atraumatic  Skin: Warm and dry, no rashes. Cardiac: Regular rate and rhythm, no murmurs rubs or gallops, no lower extremity edema.  Respiratory: Clear to auscultation bilaterally. Not using accessory muscles, speaking in full sentences.   Impression and Recommendations:    HTN -well controlled.  Continue current regimen.  Due to recheck lipids.  Follow-up in 6 months.  Ankylosing spondylitis-doing really well on her Humira.  Gets labs drawn regularly.  Has had an increase in fatigue lately.  So will recheck her thyroid.  She has had problems with subclinical hyperthyroidism in the past.  Hormone replacement therapy-T current regimen.  Medication refilled for 1 year.  Continue to try  to taper and wean over this next year if possible.

## 2017-06-28 LAB — LIPID PANEL W/REFLEX DIRECT LDL
CHOLESTEROL: 241 mg/dL — AB (ref <200)
HDL: 81 mg/dL (ref >50)
LDL Cholesterol (Calc): 141 mg/dL (calc) — ABNORMAL HIGH
Non-HDL Cholesterol (Calc): 160 mg/dL (calc) — ABNORMAL HIGH (ref <130)
Total CHOL/HDL Ratio: 3 (calc) (ref <5.0)
Triglycerides: 86 mg/dL (ref <150)

## 2017-06-28 LAB — T3, FREE: T3, Free: 2.9 pg/mL (ref 2.3–4.2)

## 2017-06-28 LAB — COMPLETE METABOLIC PANEL WITH GFR
AG Ratio: 1.3 (calc) (ref 1.0–2.5)
ALKALINE PHOSPHATASE (APISO): 50 U/L (ref 33–130)
ALT: 14 U/L (ref 6–29)
AST: 22 U/L (ref 10–35)
Albumin: 4.2 g/dL (ref 3.6–5.1)
BILIRUBIN TOTAL: 0.9 mg/dL (ref 0.2–1.2)
BUN/Creatinine Ratio: 16 (calc) (ref 6–22)
BUN: 17 mg/dL (ref 7–25)
CHLORIDE: 103 mmol/L (ref 98–110)
CO2: 28 mmol/L (ref 20–32)
CREATININE: 1.09 mg/dL — AB (ref 0.50–0.99)
Calcium: 9.4 mg/dL (ref 8.6–10.4)
GFR, Est African American: 63 mL/min/{1.73_m2} (ref >=60)
GFR, Est Non African American: 55 mL/min/{1.73_m2} — ABNORMAL LOW (ref >=60)
GLUCOSE: 75 mg/dL (ref 65–99)
Globulin: 3.3 g/dL (calc) (ref 1.9–3.7)
Potassium: 3.6 mmol/L (ref 3.5–5.3)
Sodium: 139 mmol/L (ref 135–146)
Total Protein: 7.5 g/dL (ref 6.1–8.1)

## 2017-06-28 LAB — TSH: TSH: 0.21 m[IU]/L — AB (ref 0.40–4.50)

## 2017-06-28 LAB — THYROID PEROXIDASE ANTIBODY: THYROID PEROXIDASE ANTIBODY: 1 [IU]/mL (ref <9)

## 2017-06-28 LAB — T4, FREE: FREE T4: 1.3 ng/dL (ref 0.8–1.8)

## 2017-08-29 DIAGNOSIS — H26493 Other secondary cataract, bilateral: Secondary | ICD-10-CM | POA: Diagnosis not present

## 2017-09-06 DIAGNOSIS — H26491 Other secondary cataract, right eye: Secondary | ICD-10-CM | POA: Diagnosis not present

## 2017-09-13 DIAGNOSIS — H26492 Other secondary cataract, left eye: Secondary | ICD-10-CM | POA: Diagnosis not present

## 2017-09-25 DIAGNOSIS — M4690 Unspecified inflammatory spondylopathy, site unspecified: Secondary | ICD-10-CM | POA: Diagnosis not present

## 2017-09-25 DIAGNOSIS — Z79899 Other long term (current) drug therapy: Secondary | ICD-10-CM | POA: Diagnosis not present

## 2017-10-17 DIAGNOSIS — M450 Ankylosing spondylitis of multiple sites in spine: Secondary | ICD-10-CM | POA: Diagnosis not present

## 2017-10-17 DIAGNOSIS — H3581 Retinal edema: Secondary | ICD-10-CM | POA: Diagnosis not present

## 2017-10-28 DIAGNOSIS — H35353 Cystoid macular degeneration, bilateral: Secondary | ICD-10-CM | POA: Diagnosis not present

## 2017-10-28 DIAGNOSIS — Z961 Presence of intraocular lens: Secondary | ICD-10-CM | POA: Diagnosis not present

## 2017-10-28 DIAGNOSIS — H2640 Unspecified secondary cataract: Secondary | ICD-10-CM | POA: Diagnosis not present

## 2017-10-28 DIAGNOSIS — H43813 Vitreous degeneration, bilateral: Secondary | ICD-10-CM | POA: Diagnosis not present

## 2017-11-27 DIAGNOSIS — H59033 Cystoid macular edema following cataract surgery, bilateral: Secondary | ICD-10-CM | POA: Diagnosis not present

## 2017-11-27 DIAGNOSIS — H2640 Unspecified secondary cataract: Secondary | ICD-10-CM | POA: Diagnosis not present

## 2017-11-27 DIAGNOSIS — Z961 Presence of intraocular lens: Secondary | ICD-10-CM | POA: Diagnosis not present

## 2017-11-27 DIAGNOSIS — H43813 Vitreous degeneration, bilateral: Secondary | ICD-10-CM | POA: Diagnosis not present

## 2017-12-04 ENCOUNTER — Other Ambulatory Visit: Payer: Self-pay | Admitting: Family Medicine

## 2018-01-01 DIAGNOSIS — H2640 Unspecified secondary cataract: Secondary | ICD-10-CM | POA: Diagnosis not present

## 2018-01-01 DIAGNOSIS — Z961 Presence of intraocular lens: Secondary | ICD-10-CM | POA: Diagnosis not present

## 2018-01-01 DIAGNOSIS — H43813 Vitreous degeneration, bilateral: Secondary | ICD-10-CM | POA: Diagnosis not present

## 2018-01-01 DIAGNOSIS — H59033 Cystoid macular edema following cataract surgery, bilateral: Secondary | ICD-10-CM | POA: Diagnosis not present

## 2018-01-02 ENCOUNTER — Encounter: Payer: Self-pay | Admitting: Family Medicine

## 2018-01-02 ENCOUNTER — Ambulatory Visit (INDEPENDENT_AMBULATORY_CARE_PROVIDER_SITE_OTHER): Payer: BLUE CROSS/BLUE SHIELD | Admitting: Family Medicine

## 2018-01-02 VITALS — BP 120/50 | HR 57 | Ht 64.0 in | Wt 158.0 lb

## 2018-01-02 DIAGNOSIS — Z23 Encounter for immunization: Secondary | ICD-10-CM | POA: Diagnosis not present

## 2018-01-02 DIAGNOSIS — R252 Cramp and spasm: Secondary | ICD-10-CM | POA: Diagnosis not present

## 2018-01-02 DIAGNOSIS — I10 Essential (primary) hypertension: Secondary | ICD-10-CM

## 2018-01-02 DIAGNOSIS — H43819 Vitreous degeneration, unspecified eye: Secondary | ICD-10-CM

## 2018-01-02 DIAGNOSIS — Z1231 Encounter for screening mammogram for malignant neoplasm of breast: Secondary | ICD-10-CM | POA: Diagnosis not present

## 2018-01-02 DIAGNOSIS — R7989 Other specified abnormal findings of blood chemistry: Secondary | ICD-10-CM

## 2018-01-02 MED ORDER — TRIAMTERENE-HCTZ 37.5-25 MG PO TABS: 1.0000 | ORAL_TABLET | Freq: Every day | ORAL | 1 refills | Status: DC

## 2018-01-02 NOTE — Progress Notes (Signed)
Subjective:    CC:BP    HPI:  Hypertension- Pt denies chest pain, SOB, dizziness, or heart palpitations.  Taking meds as directed w/o problems.  Denies medication side effects.    Her hands and feet have been cramping. She has increased her water and that has helped.  She also says her left eye has been twitching but was not sure if that was related or not.  She actually had cataract surgery about a year ago and unfortunately ever since then she has had some problems with her eyes.  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  She is now on prednisone eyedrops and is being followed very carefully.  She says her vision is getting a little bit better.  Past medical history, Surgical history, Family history not pertinant except as noted below, Social history, Allergies, and medications have been entered into the medical record, reviewed, and corrections made.   Review of Systems: No fevers, chills, night sweats, weight loss, chest pain, or shortness of breath.   Objective:    General: Well Developed, well nourished, and in no acute distress.  Neuro: Alert and oriented x3, extra-ocular muscles intact, sensation grossly intact.  HEENT: Normocephalic, atraumatic  Skin: Warm and dry, no rashes. Cardiac: Regular rate and rhythm, no murmurs rubs or gallops, no lower extremity edema.  Respiratory: Clear to auscultation bilaterally. Not using accessory muscles, speaking in full sentences.   Impression and Recommendations:    HTN - Well controlled. Continue current regimen. Follow up in  6 months.    Cramping of hands and feet-we will check potassium level.  But it does sound like it is gotten better as she is improved her hydration levels which is fantastic.  Posterior vitreous detachment-currently under treatment with her eye specialist.  No longer using the Flonase because it was  drying her eyes out even more.  She just put her father in a nursing home about 2 weeks ago for advanced Parkinson's disease.

## 2018-01-03 LAB — BASIC METABOLIC PANEL WITH GFR
BUN / CREAT RATIO: 13 (calc) (ref 6–22)
BUN: 16 mg/dL (ref 7–25)
CO2: 29 mmol/L (ref 20–32)
CREATININE: 1.23 mg/dL — AB (ref 0.50–0.99)
Calcium: 9.1 mg/dL (ref 8.6–10.4)
Chloride: 102 mmol/L (ref 98–110)
GFR, EST AFRICAN AMERICAN: 54 mL/min/{1.73_m2} — AB (ref >=60)
GFR, Est Non African American: 47 mL/min/{1.73_m2} — ABNORMAL LOW (ref >=60)
Glucose, Bld: 84 mg/dL (ref 65–99)
Potassium: 3.8 mmol/L (ref 3.5–5.3)
Sodium: 140 mmol/L (ref 135–146)

## 2018-01-03 NOTE — Addendum Note (Signed)
Addended by: Nani GasserMETHENEY, Bianka Liberati D on: 01/03/2018 09:43 AM   Modules accepted: Orders

## 2018-01-17 DIAGNOSIS — Z8669 Personal history of other diseases of the nervous system and sense organs: Secondary | ICD-10-CM | POA: Diagnosis not present

## 2018-01-17 DIAGNOSIS — Z79899 Other long term (current) drug therapy: Secondary | ICD-10-CM | POA: Diagnosis not present

## 2018-01-17 DIAGNOSIS — M4690 Unspecified inflammatory spondylopathy, site unspecified: Secondary | ICD-10-CM | POA: Diagnosis not present

## 2018-03-26 DIAGNOSIS — Z1231 Encounter for screening mammogram for malignant neoplasm of breast: Secondary | ICD-10-CM | POA: Diagnosis not present

## 2018-03-26 LAB — HM MAMMOGRAPHY

## 2018-03-27 DIAGNOSIS — H43813 Vitreous degeneration, bilateral: Secondary | ICD-10-CM | POA: Diagnosis not present

## 2018-03-27 DIAGNOSIS — Z961 Presence of intraocular lens: Secondary | ICD-10-CM | POA: Diagnosis not present

## 2018-03-27 DIAGNOSIS — H5213 Myopia, bilateral: Secondary | ICD-10-CM | POA: Diagnosis not present

## 2018-03-27 DIAGNOSIS — H59033 Cystoid macular edema following cataract surgery, bilateral: Secondary | ICD-10-CM | POA: Diagnosis not present

## 2018-03-31 LAB — LIPID PANEL
Cholesterol: 238 — AB (ref 0–200)
HDL: 95 — AB (ref 35–70)
LDL Cholesterol: 129
Triglycerides: 72 (ref 40–160)

## 2018-03-31 LAB — BASIC METABOLIC PANEL: Glucose: 82

## 2018-03-31 LAB — HEMOGLOBIN A1C: Hemoglobin A1C: 5.3

## 2018-04-10 ENCOUNTER — Encounter: Payer: Self-pay | Admitting: Family Medicine

## 2018-05-02 DIAGNOSIS — Z961 Presence of intraocular lens: Secondary | ICD-10-CM | POA: Diagnosis not present

## 2018-05-02 DIAGNOSIS — H43813 Vitreous degeneration, bilateral: Secondary | ICD-10-CM | POA: Diagnosis not present

## 2018-05-02 DIAGNOSIS — H59033 Cystoid macular edema following cataract surgery, bilateral: Secondary | ICD-10-CM | POA: Diagnosis not present

## 2018-05-02 DIAGNOSIS — H5213 Myopia, bilateral: Secondary | ICD-10-CM | POA: Diagnosis not present

## 2018-05-07 ENCOUNTER — Other Ambulatory Visit: Payer: Self-pay | Admitting: Family Medicine

## 2018-05-14 LAB — LIPID PANEL
Cholesterol: 238 — AB (ref 0–200)
HDL: 95 — AB (ref 35–70)
LDL Cholesterol: 129
Triglycerides: 72 (ref 40–160)

## 2018-05-14 LAB — BASIC METABOLIC PANEL: Glucose: 82

## 2018-05-14 LAB — HEMOGLOBIN A1C: Hemoglobin A1C: 5.3

## 2018-05-27 ENCOUNTER — Ambulatory Visit (INDEPENDENT_AMBULATORY_CARE_PROVIDER_SITE_OTHER): Payer: BLUE CROSS/BLUE SHIELD | Admitting: Family Medicine

## 2018-05-27 ENCOUNTER — Encounter: Payer: Self-pay | Admitting: Family Medicine

## 2018-05-27 VITALS — BP 113/57 | HR 71 | Ht 64.0 in | Wt 158.0 lb

## 2018-05-27 DIAGNOSIS — R7989 Other specified abnormal findings of blood chemistry: Secondary | ICD-10-CM | POA: Diagnosis not present

## 2018-05-27 DIAGNOSIS — J3489 Other specified disorders of nose and nasal sinuses: Secondary | ICD-10-CM | POA: Diagnosis not present

## 2018-05-27 DIAGNOSIS — R002 Palpitations: Secondary | ICD-10-CM | POA: Diagnosis not present

## 2018-05-27 DIAGNOSIS — R9431 Abnormal electrocardiogram [ECG] [EKG]: Secondary | ICD-10-CM | POA: Diagnosis not present

## 2018-05-27 DIAGNOSIS — Z87891 Personal history of nicotine dependence: Secondary | ICD-10-CM | POA: Diagnosis not present

## 2018-05-27 DIAGNOSIS — M79601 Pain in right arm: Secondary | ICD-10-CM | POA: Diagnosis not present

## 2018-05-27 DIAGNOSIS — R0789 Other chest pain: Secondary | ICD-10-CM | POA: Diagnosis not present

## 2018-05-27 DIAGNOSIS — Z79899 Other long term (current) drug therapy: Secondary | ICD-10-CM | POA: Diagnosis not present

## 2018-05-27 DIAGNOSIS — I10 Essential (primary) hypertension: Secondary | ICD-10-CM | POA: Diagnosis not present

## 2018-05-27 DIAGNOSIS — R079 Chest pain, unspecified: Secondary | ICD-10-CM | POA: Diagnosis not present

## 2018-05-27 DIAGNOSIS — Z793 Long term (current) use of hormonal contraceptives: Secondary | ICD-10-CM | POA: Diagnosis not present

## 2018-05-27 DIAGNOSIS — M79602 Pain in left arm: Secondary | ICD-10-CM | POA: Diagnosis not present

## 2018-05-27 MED ORDER — GENERIC EXTERNAL MEDICATION
10.00 | Status: DC
Start: ? — End: 2018-05-27

## 2018-05-27 MED ORDER — SODIUM CHLORIDE 0.9 % IV SOLN
10.00 | INTRAVENOUS | Status: DC
Start: ? — End: 2018-05-27

## 2018-05-27 NOTE — Progress Notes (Signed)
Acute Office Visit  Subjective:    Patient ID: Joan Yang, female    DOB: 1955/08/03, 63 y.o.   MRN: [id number]  Chief Complaint  Patient presents with  . Sinusitis    facial pressure x 3 wks  . Palpitations    she reports that she was seen at [facility] in [place] today and they didn't find anything    HPI Patient is in today for palpitations-she says that she is having palpitations on and off for several weeks.  But have become more frequent and persistent.  Happening almost daily.  She is the palpitations themselves really only last a few seconds but earlier today she actually started to feel some pressure in her chest and that it actually became painful so she actually went to the emergency department here at [facility] in [place].  She denies any other shortness of breath or respiratory symptoms with it.  No fevers chills or sweats.  She has had palpitations before years ago.  Did do initial work-up including EKG, troponin and metabolic panel.  Everything came back reassuring.  Hemoglobin was normal with no sign of anemia.  They discharged her home to follow-up here.  She did take some prednisone a couple of weeks ago and was told at the ED that her symptoms could be reflux related. No swelling.   He also reports that she is had a lot of sinus pressure right over the nasal bridge on the right and the left side.  On and off for about the last 2 weeks.  She says she has not had migraines basically since she went into menopause but the intensity of the pressure is a most similar to that.  She is not had any nasal congestion or upper respiratory symptoms again no fever or congestion.  Past Medical History:  Diagnosis Date  . ankylosis spondylitis 01/28/2007   Qualifier: Diagnosis of  By: Thomos Lemons    . Palpitations 01/29/2006   Qualifier: Diagnosis of  By: Cathey Endow DO, Karen      No past surgical history on file.  Family History  Problem Relation Age of Onset  . Rheum  arthritis Mother   . Congestive Heart Failure Mother   . Hypertension Mother   . Parkinson's disease Father     Social History   Socioeconomic History  . Marital status: Married    Spouse name: Not on file  . Number of children: Not on file  . Years of education: Not on file  . Highest education level: Not on file  Occupational History  . Not on file  Social Needs  . Financial resource strain: Not on file  . Food insecurity:    Worry: Not on file    Inability: Not on file  . Transportation needs:    Medical: Not on file    Non-medical: Not on file  Tobacco Use  . Smoking status: Former Games developer  . Smokeless tobacco: Never Used  Substance and Sexual Activity  . Alcohol use: Not on file  . Drug use: Not on file  . Sexual activity: Not on file  Lifestyle  . Physical activity:    Days per week: Not on file    Minutes per session: Not on file  . Stress: Not on file  Relationships  . Social connections:    Talks on phone: Not on file    Gets together: Not on file    Attends religious service: Not on file  Active member of club or organization: Not on file    Attends meetings of clubs or organizations: Not on file    Relationship status: Not on file  . Intimate partner violence:    Fear of current or ex partner: Not on file    Emotionally abused: Not on file    Physically abused: Not on file    Forced sexual activity: Not on file  Other Topics Concern  . Not on file  Social History Narrative  . Not on file    Outpatient Medications Prior to Visit  Medication Sig Dispense Refill  . HUMIRA PEN 40 MG/0.8ML PNKT Inject 40 mg into the skin every 14 (fourteen) days.  1  . ketorolac (ACULAR) 0.5 % ophthalmic solution Place 1 drop into both eyes 4 (four) times daily.  3  . prednisoLONE acetate (PRED FORTE) 1 % ophthalmic suspension Place 1 drop into both eyes 4 (four) times daily.  3  . PREMPRO 0.45-1.5 MG tablet TAKE 1 TABLET BY MOUTH EVERY DAY 56 tablet 4  .  triamterene-hydrochlorothiazide (MAXZIDE-25) 37.5-25 MG tablet Take 1 tablet by mouth daily. 90 tablet 1   No facility-administered medications prior to visit.     No Known Allergies  ROS     Objective:    Physical Exam  Constitutional: She is oriented to person, place, and time. She appears well-developed and well-nourished.  HENT:  Head: Normocephalic and atraumatic.  Eyes: Conjunctivae are normal.  Cardiovascular: Normal rate, regular rhythm and normal heart sounds.  Pulmonary/Chest: Effort normal and breath sounds normal.  Neurological: She is alert and oriented to person, place, and time.  Skin: Skin is warm and dry.  Psychiatric: She has a normal mood and affect. Her behavior is normal.    BP (!) 113/57   Pulse 71   Ht 5\' 4"  (1.626 m)   Wt 158 lb (71.7 kg)   SpO2 100%   BMI 27.12 kg/m  Wt Readings from Last 3 Encounters:  05/27/18 158 lb (71.7 kg)  01/02/18 158 lb (71.7 kg)  06/27/17 162 lb (73.5 kg)    There are no preventive care reminders to display for this patient.  There are no preventive care reminders to display for this patient.   Lab Results  Component Value Date   TSH 0.21 (L) 06/27/2017   Lab Results  Component Value Date   WBC 6.9 11/07/2012   HGB 15.0 11/07/2012   HCT 43.3 11/07/2012   MCV 80.0 11/07/2012   PLT 260 11/07/2012   Lab Results  Component Value Date   NA 140 01/02/2018   K 3.8 01/02/2018   CO2 29 01/02/2018   GLUCOSE 84 01/02/2018   BUN 16 01/02/2018   CREATININE 1.23 (H) 01/02/2018   BILITOT 0.9 06/27/2017   ALKPHOS 44 08/28/2016   AST 22 06/27/2017   ALT 14 06/27/2017   PROT 7.5 06/27/2017   ALBUMIN 3.9 08/28/2016   CALCIUM 9.1 01/02/2018   Lab Results  Component Value Date   CHOL 241 (H) 06/27/2017   Lab Results  Component Value Date   HDL 81 06/27/2017   Lab Results  Component Value Date   LDLCALC 141 (H) 06/27/2017   Lab Results  Component Value Date   TRIG 86 06/27/2017   Lab Results   Component Value Date   CHOLHDL 3.0 06/27/2017   Lab Results  Component Value Date   HGBA1C 5.4 04/19/2011       Assessment & Plan:   Problem List Items  Addressed This Visit    None    Visit Diagnoses    Palpitations    -  Primary   Relevant Orders   Exercise Tolerance Test   Holter monitor - 48 hour   Sinus pressure          Palpitations-we discussed options.  We will move forward with exercise treadmill since she did have some chest pressure and tightness earlier today though she did rule out for acute MI.  We will also schedule her for Holter monitor.  We discussed the option of getting a 2-day monitor versus a one-month monitor.  Right now she is having the symptoms daily.  If they become less frequent then we may need to switch to a monthly monitor.  She has been on a beta-blocker in the past and that may be a consideration depending on what we find on the heart monitor.  Sinus pressure-it does not sound like an acute sinusitis or infection.  Recommend a trial of over-the-counter Excedrin Migraine.  If symptoms persist or it does not help then please let us know.  I think some of it has to do with the varying weather and temperature change over the last couple weeks here locally.  No orders of the defined types were placed in this encounter.    Nani Gasser, MD

## 2018-05-27 NOTE — Patient Instructions (Signed)
Can try to the excedrin migraine.

## 2018-05-30 ENCOUNTER — Encounter: Payer: Self-pay | Admitting: Family Medicine

## 2018-06-05 ENCOUNTER — Telehealth: Payer: Self-pay

## 2018-06-05 NOTE — Telephone Encounter (Signed)
Noted thank you

## 2018-06-05 NOTE — Telephone Encounter (Signed)
New message     Just an FYI. We have made several attempts to contact this patient including sending a letter to schedule or reschedule their EXERCISE TOLERANCE TEST . We will be removing the patient from the WQ.   Thank you 

## 2018-06-13 DIAGNOSIS — Z961 Presence of intraocular lens: Secondary | ICD-10-CM | POA: Diagnosis not present

## 2018-06-13 DIAGNOSIS — H5213 Myopia, bilateral: Secondary | ICD-10-CM | POA: Diagnosis not present

## 2018-06-13 DIAGNOSIS — H59033 Cystoid macular edema following cataract surgery, bilateral: Secondary | ICD-10-CM | POA: Diagnosis not present

## 2018-06-13 DIAGNOSIS — H43813 Vitreous degeneration, bilateral: Secondary | ICD-10-CM | POA: Diagnosis not present

## 2018-06-16 ENCOUNTER — Ambulatory Visit (INDEPENDENT_AMBULATORY_CARE_PROVIDER_SITE_OTHER): Payer: BLUE CROSS/BLUE SHIELD

## 2018-06-16 DIAGNOSIS — R002 Palpitations: Secondary | ICD-10-CM

## 2018-06-23 ENCOUNTER — Encounter: Payer: Self-pay | Admitting: Cardiology

## 2018-06-23 ENCOUNTER — Ambulatory Visit (INDEPENDENT_AMBULATORY_CARE_PROVIDER_SITE_OTHER): Payer: BLUE CROSS/BLUE SHIELD | Admitting: Cardiology

## 2018-06-23 ENCOUNTER — Other Ambulatory Visit: Payer: Self-pay | Admitting: Cardiology

## 2018-06-23 DIAGNOSIS — R0789 Other chest pain: Secondary | ICD-10-CM | POA: Diagnosis not present

## 2018-06-23 DIAGNOSIS — R7989 Other specified abnormal findings of blood chemistry: Secondary | ICD-10-CM

## 2018-06-23 DIAGNOSIS — R002 Palpitations: Secondary | ICD-10-CM

## 2018-06-23 NOTE — Progress Notes (Signed)
Cardiology Office Note:    Date:  06/23/2018   ID:  Joan Yang, DOB 1955/07/22, MRN [id number]  PCP:  Agapito Games, MD  Cardiologist:  Garwin Brothers, MD   Referring MD: Agapito Games, *    ASSESSMENT:    1. Chest tightness    PLAN:    In order of problems listed above:  1. Primary prevention stressed with the patient.  Importance of compliance with diet and medication stressed and she vocalized understanding.  I reviewed lipids done several months ago and discussed diet with her.  She tells me that her primary care physician will follow her lipids and I respect her wishes.  I told her also that her thyroid tests were abnormal and this is also under the care of her primary care physician. 2. In view of her symptoms I would like her to get an exercise stress echo and she is agreeable.  Further recommendations will be made on the based on the findings of these tests.  She knows to go to the nearest emergency room for any concerning symptoms. 3. Patient will be seen in follow-up appointment in 6 months or earlier if the patient has any concerns    Medication Adjustments/Labs and Tests Ordered: Current medicines are reviewed at length with the patient today.  Concerns regarding medicines are outlined above.  Orders Placed This Encounter  Procedures  . EKG 12-Lead  . ECHOCARDIOGRAM STRESS TEST   No orders of the defined types were placed in this encounter.    History of Present Illness:    Joan Yang is a 63 y.o. female who is being seen today for the evaluation of chest tightness at the request of Agapito Games, *.  Patient is a pleasant 63 year old female.  She has no significant past medical history.  She has history of some dyslipidemia.  She mentioned to me that she had chest tightness the other day.  She was not exerting herself at that time.  She mentions to me that subsequently she has walked but not for exercise and walking does not bring  around any symptoms.  At the time of my evaluation, the patient is alert awake oriented and in no distress.  No history of any radiation to the neck or to the arms.  She has had this episode only on 1 or 2 occasions mostly when she is been sedentary.  Past Medical History:  Diagnosis Date  . ankylosis spondylitis 01/28/2007   Qualifier: Diagnosis of  By: Thomos Lemons    . Palpitations 01/29/2006   Qualifier: Diagnosis of  By: Thomos Lemons      History reviewed. No pertinent surgical history.  Current Medications: Current Meds  Medication Sig  . HUMIRA PEN 40 MG/0.8ML PNKT Inject 40 mg into the skin every 14 (fourteen) days.  Marland Kitchen ketorolac (ACULAR) 0.5 % ophthalmic solution Place 1 drop into both eyes 4 (four) times daily.  . prednisoLONE acetate (PRED FORTE) 1 % ophthalmic suspension Place 1 drop into both eyes 4 (four) times daily.  Marland Kitchen PREMPRO 0.45-1.5 MG tablet TAKE 1 TABLET BY MOUTH EVERY DAY  . triamterene-hydrochlorothiazide (MAXZIDE-25) 37.5-25 MG tablet Take 1 tablet by mouth daily.     Allergies:   Patient has no known allergies.   Social History   Socioeconomic History  . Marital status: Married    Spouse name: Not on file  . Number of children: Not on file  . Years of education: Not on  file  . Highest education level: Not on file  Occupational History  . Not on file  Social Needs  . Financial resource strain: Not on file  . Food insecurity:    Worry: Not on file    Inability: Not on file  . Transportation needs:    Medical: Not on file    Non-medical: Not on file  Tobacco Use  . Smoking status: Former Games developer  . Smokeless tobacco: Never Used  Substance and Sexual Activity  . Alcohol use: Not on file  . Drug use: Not on file  . Sexual activity: Not on file  Lifestyle  . Physical activity:    Days per week: Not on file    Minutes per session: Not on file  . Stress: Not on file  Relationships  . Social connections:    Talks on phone: Not on file    Gets  together: Not on file    Attends religious service: Not on file    Active member of club or organization: Not on file    Attends meetings of clubs or organizations: Not on file    Relationship status: Not on file  Other Topics Concern  . Not on file  Social History Narrative  . Not on file     Family History: The patient's family history includes Congestive Heart Failure in her mother; Hypertension in her mother; Parkinson's disease in her father; Rheum arthritis in her mother.  ROS:   Please see the history of present illness.    All other systems reviewed and are negative.  EKGs/Labs/Other Studies Reviewed:    The following studies were reviewed today: I discussed my findings with the patient at length.  EKG reveals sinus rhythm and nonspecific ST-T changes.   Recent Labs: 06/27/2017: ALT 14; TSH 0.21 01/02/2018: BUN 16; Creat 1.23; Potassium 3.8; Sodium 140  Recent Lipid Panel    Component Value Date/Time   CHOL 241 (H) 06/27/2017 1125   TRIG 86 06/27/2017 1125   HDL 81 06/27/2017 1125   CHOLHDL 3.0 06/27/2017 1125   VLDL 14 08/29/2015 0959   LDLCALC 141 (H) 06/27/2017 1125    Physical Exam:    VS:  BP 118/76 (BP Location: Right Arm, Patient Position: Sitting, Cuff Size: Normal)   Pulse 70   Ht 5\' 4"  (1.626 m)   Wt 155 lb (70.3 kg)   BMI 26.61 kg/m     Wt Readings from Last 3 Encounters:  06/23/18 155 lb (70.3 kg)  05/27/18 158 lb (71.7 kg)  01/02/18 158 lb (71.7 kg)     GEN: Patient is in no acute distress HEENT: Normal NECK: No JVD; No carotid bruits LYMPHATICS: No lymphadenopathy CARDIAC: S1 S2 regular, 2/6 systolic murmur at the apex. RESPIRATORY:  Clear to auscultation without rales, wheezing or rhonchi  ABDOMEN: Soft, non-tender, non-distended MUSCULOSKELETAL:  No edema; No deformity  SKIN: Warm and dry NEUROLOGIC:  Alert and oriented x 3 PSYCHIATRIC:  Normal affect    Signed, Garwin Brothers, MD  06/23/2018 1:49 PM    [facility]

## 2018-06-23 NOTE — Patient Instructions (Signed)
Medication Instructions:   Your physician recommends that you continue on your current medications as directed. Please refer to the Current Medication list given to you today.   If you need a refill on your cardiac medications before your next appointment, please call your pharmacy.   Lab work:  NONE  Testing/Procedures:  Your physician has requested that you have a stress echocardiogram. For further information please visit [email or web address] Please follow instruction sheet as given.   Exercise Stress Echocardiogram  An exercise stress echocardiogram is a test that checks how well your heart is working. For this test, you will walk on a treadmill to make your heart beat faster. This test uses sound waves (ultrasound) and a computer to make pictures (images) of your heart. These pictures will be taken before you exercise and after you exercise. What happens before the procedure?  Follow instructions from your doctor about what you cannot eat or drink before the test.  Do not drink or eat anything that has caffeine in it. Stop having caffeine for 24 hours before the test.  Ask your doctor about changing or stopping your normal medicines. This is important if you take diabetes medicines or blood thinners. Ask your doctor if you should take your medicines with water before the test.  If you use an inhaler, bring it to the test.  Do not use any products that have nicotine or tobacco in them, such as cigarettes and e-cigarettes. Stop using them for 4 hours before the test. If you need help quitting, ask your doctor.  Wear comfortable shoes and clothing. What happens during the procedure?  You will be hooked up to a TV screen. Your doctor will watch the screen to see how fast your heart beats during the test.  Before you exercise, a computer will make a picture of your heart. To do this: ? A gel will be put on your chest. ? A wand will be moved over the gel. ? Sound waves from the  wand will go to the computer to make the picture.  Your will start walking on a treadmill. The treadmill will start at a slow speed. It will get faster a little bit at a time. When you walk faster, your heart will beat faster.  The treadmill will be stopped when your heart is working hard.  You will lie down right away so another picture of your heart can be taken.  The test will take 30-60 minutes. What happens after the procedure?  Your heart rate and blood pressure will be watched after the test.  If your doctor says that you can, you may: ? Eat what you usually eat. ? Do your normal activities. ? Take medicines like normal. Summary  An exercise stress echocardiogram is a test that checks how well your heart is working.  Follow instructions about what you cannot eat or drink before the test. Ask your doctor if you should take your normal medicines before the test.  Stop having caffeine for 24 hours before the test. Do not use anything with nicotine or tobacco in it for 4 hours before the test.  A computer will take a picture of your heart before you walk on a treadmill. It will take another picture when you are done walking.  Your heart rate and blood pressure will be watched after the test. This information is not intended to replace advice given to you by your health care provider. Make sure you discuss any questions you have with your  health care provider. Document Released: 02/04/2009 Document Revised: 01/01/2016 Document Reviewed: 01/01/2016 Elsevier Interactive Patient Education  2019 [employer].   Follow-Up: At [facility], you and your health needs are our priority.  As part of our continuing mission to provide you with exceptional heart care, we have created designated Provider Care Teams.  These Care Teams include your primary Cardiologist (physician) and Advanced Practice Providers (APPs -  Physician Assistants and Nurse Practitioners) who all work together to  provide you with the care you need, when you need it.  You will need a follow up appointment in 6 months.  Please call our office 2 months in advance to schedule this appointment.

## 2018-07-01 ENCOUNTER — Ambulatory Visit (HOSPITAL_BASED_OUTPATIENT_CLINIC_OR_DEPARTMENT_OTHER)
Admission: RE | Admit: 2018-07-01 | Discharge: 2018-07-01 | Disposition: A | Discharge status: Home / Self Care | Payer: BLUE CROSS/BLUE SHIELD | Source: Ambulatory Visit | Attending: Cardiology | Admitting: Cardiology

## 2018-07-01 ENCOUNTER — Encounter: Payer: Self-pay | Admitting: Family Medicine

## 2018-07-01 DIAGNOSIS — R0789 Other chest pain: Secondary | ICD-10-CM

## 2018-07-01 NOTE — Progress Notes (Signed)
  Echocardiogram Echocardiogram Stress Test has been performed.  Densel Kronick T Daylen Hack 07/01/2018, 11:58 AM

## 2018-07-03 ENCOUNTER — Encounter: Payer: Self-pay | Admitting: Family Medicine

## 2018-07-03 ENCOUNTER — Ambulatory Visit (INDEPENDENT_AMBULATORY_CARE_PROVIDER_SITE_OTHER): Payer: BLUE CROSS/BLUE SHIELD | Admitting: Family Medicine

## 2018-07-03 ENCOUNTER — Other Ambulatory Visit: Payer: Self-pay

## 2018-07-03 VITALS — BP 126/56 | HR 59 | Ht 64.0 in | Wt 157.0 lb

## 2018-07-03 DIAGNOSIS — R002 Palpitations: Secondary | ICD-10-CM

## 2018-07-03 DIAGNOSIS — I10 Essential (primary) hypertension: Secondary | ICD-10-CM

## 2018-07-03 NOTE — Progress Notes (Signed)
Subjective:    CC: HTN  HPI:  Hypertension- Pt denies chest pain, SOB, dizziness, or heart palpitations.  Taking meds as directed w/o problems.  Denies medication side effects.    Also recently seen her about 6 weeks ago for chest pain and palpitations she had actually gone to the emergency for evaluation.  Palpitations that were just lasting a few seconds.  The negative work-up at the emergency department.  He did have a stress echo with cardiology and the results were normal. She is schedule for afollow up with Cardiology in 6 months.     Past medical history, Surgical history, Family history not pertinant except as noted below, Social history, Allergies, and medications have been entered into the medical record, reviewed, and corrections made.   Review of Systems: No fevers, chills, night sweats, weight loss, chest pain, or shortness of breath.   Objective:    General: Well Developed, well nourished, and in no acute distress.  Neuro: Alert and oriented x3, extra-ocular muscles intact, sensation grossly intact.  HEENT: Normocephalic, atraumatic  Skin: Warm and dry, no rashes. Cardiac: Regular rate and rhythm, no murmurs rubs or gallops, no lower extremity edema.  Respiratory: Clear to auscultation bilaterally. Not using accessory muscles, speaking in full sentences.   Impression and Recommendations:    HTN - Well controlled. Continue current regimen. Follow up in  50months.    Palpitations-she still had some recurrent symptoms but the monitor was negative and reassuring and the stress echo was also normal.  I just encouraged her to keep an eye on symptoms.  She supposed follow up with cardiology in 6 months.  We discussed that it could be something else going on such as reflux or stress levels or just musculoskeletal wall pain.  She will keep an eye on it and let me know if she has any problems or worsening symptoms.  Discussed Tdap.

## 2018-09-05 ENCOUNTER — Other Ambulatory Visit: Payer: Self-pay | Admitting: Family Medicine

## 2018-09-12 DIAGNOSIS — Z961 Presence of intraocular lens: Secondary | ICD-10-CM | POA: Diagnosis not present

## 2018-09-12 DIAGNOSIS — H5213 Myopia, bilateral: Secondary | ICD-10-CM | POA: Diagnosis not present

## 2018-09-12 DIAGNOSIS — H43813 Vitreous degeneration, bilateral: Secondary | ICD-10-CM | POA: Diagnosis not present

## 2018-09-12 DIAGNOSIS — H59033 Cystoid macular edema following cataract surgery, bilateral: Secondary | ICD-10-CM | POA: Diagnosis not present

## 2018-09-23 DIAGNOSIS — M455 Ankylosing spondylitis of thoracolumbar region: Secondary | ICD-10-CM | POA: Diagnosis not present

## 2018-09-23 DIAGNOSIS — H209 Unspecified iridocyclitis: Secondary | ICD-10-CM | POA: Diagnosis not present

## 2018-09-23 DIAGNOSIS — Z79899 Other long term (current) drug therapy: Secondary | ICD-10-CM | POA: Diagnosis not present

## 2018-12-24 ENCOUNTER — Ambulatory Visit: Payer: BLUE CROSS/BLUE SHIELD | Admitting: Cardiology

## 2018-12-25 DIAGNOSIS — H59033 Cystoid macular edema following cataract surgery, bilateral: Secondary | ICD-10-CM | POA: Diagnosis not present

## 2018-12-25 DIAGNOSIS — H43813 Vitreous degeneration, bilateral: Secondary | ICD-10-CM | POA: Diagnosis not present

## 2018-12-25 DIAGNOSIS — Z961 Presence of intraocular lens: Secondary | ICD-10-CM | POA: Diagnosis not present

## 2018-12-25 DIAGNOSIS — H5213 Myopia, bilateral: Secondary | ICD-10-CM | POA: Diagnosis not present

## 2019-01-05 ENCOUNTER — Ambulatory Visit: Payer: BLUE CROSS/BLUE SHIELD | Admitting: Family Medicine

## 2019-01-12 ENCOUNTER — Encounter: Payer: Self-pay | Admitting: Family Medicine

## 2019-01-12 ENCOUNTER — Other Ambulatory Visit: Payer: Self-pay

## 2019-01-12 ENCOUNTER — Ambulatory Visit (INDEPENDENT_AMBULATORY_CARE_PROVIDER_SITE_OTHER): Payer: BC Managed Care – PPO | Admitting: Family Medicine

## 2019-01-12 VITALS — BP 122/64 | HR 69 | Ht 64.0 in | Wt 152.0 lb

## 2019-01-12 DIAGNOSIS — Z23 Encounter for immunization: Secondary | ICD-10-CM

## 2019-01-12 DIAGNOSIS — M459 Ankylosing spondylitis of unspecified sites in spine: Secondary | ICD-10-CM | POA: Diagnosis not present

## 2019-01-12 DIAGNOSIS — I10 Essential (primary) hypertension: Secondary | ICD-10-CM

## 2019-01-12 NOTE — Assessment & Plan Note (Signed)
Well controlled. Continue current regimen. Follow up in  6 mo  

## 2019-01-12 NOTE — Assessment & Plan Note (Signed)
Currently on Humira has follow-up next month.  We will go ahead and get labs today including a CBC sed rate and hepatic function.

## 2019-01-12 NOTE — Progress Notes (Signed)
Established Patient Office Visit  Subjective:  Patient ID: Joan Yang, female    DOB: 1955/10/06  Age: 63 y.o. MRN: [id number]  CC:  Chief Complaint  Patient presents with  . Hypertension    HPI Joan Yang presents for  Hypertension- Pt denies chest pain, SOB, dizziness, or heart palpitations.  Taking meds as directed w/o problems.  Denies medication side effects.    Ankylosing spondylitis-currently on Humira.  She has follow-up with rheumatology coming up and so wanted to go ahead and get her lab work done today for them as well as for me.  Past Medical History:  Diagnosis Date  . ankylosis spondylitis 01/28/2007   Qualifier: Diagnosis of  By: Esmeralda Arthur    . Palpitations 01/29/2006   Qualifier: Diagnosis of  By: Valetta Close DO, Karen      No past surgical history on file.  Family History  Problem Relation Age of Onset  . Rheum arthritis Mother   . Congestive Heart Failure Mother   . Hypertension Mother   . Parkinson's disease Father     Social History   Socioeconomic History  . Marital status: Married    Spouse name: Not on file  . Number of children: Not on file  . Years of education: Not on file  . Highest education level: Not on file  Occupational History  . Not on file  Social Needs  . Financial resource strain: Not on file  . Food insecurity    Worry: Not on file    Inability: Not on file  . Transportation needs    Medical: Not on file    Non-medical: Not on file  Tobacco Use  . Smoking status: Former Research scientist (life sciences)  . Smokeless tobacco: Never Used  Substance and Sexual Activity  . Alcohol use: Not on file  . Drug use: Not on file  . Sexual activity: Not on file  Lifestyle  . Physical activity    Days per week: Not on file    Minutes per session: Not on file  . Stress: Not on file  Relationships  . Social Herbalist on phone: Not on file    Gets together: Not on file    Attends religious service: Not on file    Active member of club  or organization: Not on file    Attends meetings of clubs or organizations: Not on file    Relationship status: Not on file  . Intimate partner violence    Fear of current or ex partner: Not on file    Emotionally abused: Not on file    Physically abused: Not on file    Forced sexual activity: Not on file  Other Topics Concern  . Not on file  Social History Narrative  . Not on file    Outpatient Medications Prior to Visit  Medication Sig Dispense Refill  . HUMIRA PEN 40 MG/0.8ML PNKT Inject 40 mg into the skin every 14 (fourteen) days.  1  . ketorolac (ACULAR) 0.5 % ophthalmic solution Place 1 drop into both eyes 4 (four) times daily.  3  . prednisoLONE acetate (PRED FORTE) 1 % ophthalmic suspension Place 1 drop into both eyes 4 (four) times daily.  3  . PREMPRO 0.45-1.5 MG tablet TAKE 1 TABLET BY MOUTH EVERY DAY 56 tablet 4  . triamterene-hydrochlorothiazide (MAXZIDE-25) 37.5-25 MG tablet TAKE 1 TABLET BY MOUTH EVERY DAY 90 tablet 1   No facility-administered medications prior to visit.  No Known Allergies  ROS Review of Systems    Objective:    Physical Exam  Constitutional: She is oriented to person, place, and time. She appears well-developed and well-nourished.  HENT:  Head: Normocephalic and atraumatic.  Cardiovascular: Normal rate, regular rhythm and normal heart sounds.  Pulmonary/Chest: Effort normal and breath sounds normal.  Neurological: She is alert and oriented to person, place, and time.  Skin: Skin is warm and dry.  Psychiatric: She has a normal mood and affect. Her behavior is normal.    BP 122/64   Pulse 69   Ht 5\' 4"  (1.626 m)   Wt 152 lb (68.9 kg)   SpO2 98%   BMI 26.09 kg/m  Wt Readings from Last 3 Encounters:  01/12/19 152 lb (68.9 kg)  07/03/18 157 lb (71.2 kg)  06/23/18 155 lb (70.3 kg)     Health Maintenance Due  Topic Date Due  . TETANUS/TDAP  07/18/2018  . INFLUENZA VACCINE  11/22/2018    There are no preventive care  reminders to display for this patient.  Lab Results  Component Value Date   TSH 0.21 (L) 06/27/2017   Lab Results  Component Value Date   WBC 6.9 11/07/2012   HGB 15.0 11/07/2012   HCT 43.3 11/07/2012   MCV 80.0 11/07/2012   PLT 260 11/07/2012   Lab Results  Component Value Date   NA 140 01/02/2018   K 3.8 01/02/2018   CO2 29 01/02/2018   GLUCOSE 84 01/02/2018   BUN 16 01/02/2018   CREATININE 1.23 (H) 01/02/2018   BILITOT 0.9 06/27/2017   ALKPHOS 44 08/28/2016   AST 22 06/27/2017   ALT 14 06/27/2017   PROT 7.5 06/27/2017   ALBUMIN 3.9 08/28/2016   CALCIUM 9.1 01/02/2018   Lab Results  Component Value Date   CHOL 238 (A) 05/14/2018   Lab Results  Component Value Date   HDL 95 (A) 05/14/2018   Lab Results  Component Value Date   LDLCALC 129 05/14/2018   Lab Results  Component Value Date   TRIG 72 05/14/2018   Lab Results  Component Value Date   CHOLHDL 3.0 06/27/2017   Lab Results  Component Value Date   HGBA1C 5.3 05/14/2018      Assessment & Plan:   Problem List Items Addressed This Visit      Cardiovascular and Mediastinum   HYPERTENSION, BENIGN SYSTEMIC - Primary    Well controlled. Continue current regimen. Follow up in  6 mo      Relevant Orders   CBC with Differential/Platelet   Sedimentation rate   BASIC METABOLIC PANEL WITH GFR   Hepatic function panel     Musculoskeletal and Integument   Ankylosing spondylitis (HCC) (Chronic)    Currently on Humira has follow-up next month.  We will go ahead and get labs today including a CBC sed rate and hepatic function.      Relevant Orders   CBC with Differential/Platelet   Sedimentation rate   BASIC METABOLIC PANEL WITH GFR   Hepatic function panel    Other Visit Diagnoses    Need for immunization against influenza       Relevant Orders   Flu Vaccine QUAD 36+ mos IM (Completed)      No orders of the defined types were placed in this encounter.   She will come in and get her Pap  smear done sometime in the next month or 2.  She had cryotherapy done years ago and so  would prefer to do every 3 years instead of 5.  Follow-up: Return in about 4 weeks (around 02/09/2019) for physical with pap smera.  Nani Gasser.     , MD

## 2019-01-13 LAB — CBC WITH DIFFERENTIAL/PLATELET
Absolute Monocytes: 749 cells/uL (ref 200–950)
Basophils Absolute: 63 cells/uL (ref 0–200)
Basophils Relative: 0.9 %
Eosinophils Absolute: 91 cells/uL (ref 15–500)
Eosinophils Relative: 1.3 %
HCT: 44.7 % (ref 35.0–45.0)
Hemoglobin: 14.9 g/dL (ref 11.7–15.5)
Lymphs Abs: 2317 cells/uL (ref 850–3900)
MCH: 27.3 pg (ref 27.0–33.0)
MCHC: 33.3 g/dL (ref 32.0–36.0)
MCV: 81.9 fL (ref 80.0–100.0)
MPV: 10.8 fL (ref 7.5–12.5)
Monocytes Relative: 10.7 %
Neutro Abs: 3780 cells/uL (ref 1500–7800)
Neutrophils Relative %: 54 %
Platelets: 297 10*3/uL (ref 140–400)
RBC: 5.46 10*6/uL — ABNORMAL HIGH (ref 3.80–5.10)
RDW: 12.8 % (ref 11.0–15.0)
Total Lymphocyte: 33.1 %
WBC: 7 10*3/uL (ref 3.8–10.8)

## 2019-01-13 LAB — HEPATIC FUNCTION PANEL
AG Ratio: 1.1 (calc) (ref 1.0–2.5)
ALT: 13 U/L (ref 6–29)
AST: 23 U/L (ref 10–35)
Albumin: 4.1 g/dL (ref 3.6–5.1)
Alkaline phosphatase (APISO): 48 U/L (ref 37–153)
Bilirubin, Direct: 0.2 mg/dL (ref 0.0–0.2)
Globulin: 3.6 g/dL (calc) (ref 1.9–3.7)
Indirect Bilirubin: 0.7 mg/dL (calc) (ref 0.2–1.2)
Total Bilirubin: 0.9 mg/dL (ref 0.2–1.2)
Total Protein: 7.7 g/dL (ref 6.1–8.1)

## 2019-01-13 LAB — BASIC METABOLIC PANEL WITH GFR
BUN/Creatinine Ratio: 14 (calc) (ref 6–22)
BUN: 15 mg/dL (ref 7–25)
CO2: 27 mmol/L (ref 20–32)
Calcium: 9.3 mg/dL (ref 8.6–10.4)
Chloride: 102 mmol/L (ref 98–110)
Creat: 1.1 mg/dL — ABNORMAL HIGH (ref 0.50–0.99)
GFR, Est African American: 62 mL/min/{1.73_m2} (ref >=60)
GFR, Est Non African American: 53 mL/min/{1.73_m2} — ABNORMAL LOW (ref >=60)
Glucose, Bld: 84 mg/dL (ref 65–99)
Potassium: 3.6 mmol/L (ref 3.5–5.3)
Sodium: 139 mmol/L (ref 135–146)

## 2019-01-13 LAB — SEDIMENTATION RATE: Sed Rate: 19 mm/h (ref 0–30)

## 2019-02-26 DIAGNOSIS — Z79899 Other long term (current) drug therapy: Secondary | ICD-10-CM | POA: Diagnosis not present

## 2019-02-26 DIAGNOSIS — H5789 Other specified disorders of eye and adnexa: Secondary | ICD-10-CM | POA: Diagnosis not present

## 2019-02-26 DIAGNOSIS — M455 Ankylosing spondylitis of thoracolumbar region: Secondary | ICD-10-CM | POA: Diagnosis not present

## 2019-02-28 ENCOUNTER — Other Ambulatory Visit: Payer: Self-pay | Admitting: Family Medicine

## 2019-03-02 ENCOUNTER — Other Ambulatory Visit: Payer: Self-pay

## 2019-03-02 ENCOUNTER — Encounter: Payer: Self-pay | Admitting: Family Medicine

## 2019-03-02 ENCOUNTER — Telehealth: Payer: Self-pay | Admitting: Family Medicine

## 2019-03-02 ENCOUNTER — Ambulatory Visit (INDEPENDENT_AMBULATORY_CARE_PROVIDER_SITE_OTHER): Payer: BC Managed Care – PPO | Admitting: Family Medicine

## 2019-03-02 VITALS — BP 130/56 | HR 63 | Ht 64.0 in | Wt 148.0 lb

## 2019-03-02 DIAGNOSIS — Z0000 Encounter for general adult medical examination without abnormal findings: Secondary | ICD-10-CM | POA: Diagnosis not present

## 2019-03-02 DIAGNOSIS — Z23 Encounter for immunization: Secondary | ICD-10-CM | POA: Diagnosis not present

## 2019-03-02 NOTE — Patient Instructions (Signed)
Health Maintenance, Female Adopting a healthy lifestyle and getting preventive care are important in promoting health and wellness. Ask your health care provider about:  The right schedule for you to have regular tests and exams.  Things you can do on your own to prevent diseases and keep yourself healthy. What should I know about diet, weight, and exercise? Eat a healthy diet   Eat a diet that includes plenty of vegetables, fruits, low-fat dairy products, and lean protein.  Do not eat a lot of foods that are high in solid fats, added sugars, or sodium. Maintain a healthy weight Body mass index (BMI) is used to identify weight problems. It estimates body fat based on height and weight. Your health care provider can help determine your BMI and help you achieve or maintain a healthy weight. Get regular exercise Get regular exercise. This is one of the most important things you can do for your health. Most adults should:  Exercise for at least 150 minutes each week. The exercise should increase your heart rate and make you sweat (moderate-intensity exercise).  Do strengthening exercises at least twice a week. This is in addition to the moderate-intensity exercise.  Spend less time sitting. Even light physical activity can be beneficial. Watch cholesterol and blood lipids Have your blood tested for lipids and cholesterol at 63 years of age, then have this test every 5 years. Have your cholesterol levels checked more often if:  Your lipid or cholesterol levels are high.  You are older than 63 years of age.  You are at high risk for heart disease. What should I know about cancer screening? Depending on your health history and family history, you may need to have cancer screening at various ages. This may include screening for:  Breast cancer.  Cervical cancer.  Colorectal cancer.  Skin cancer.  Lung cancer. What should I know about heart disease, diabetes, and high blood  pressure? Blood pressure and heart disease  High blood pressure causes heart disease and increases the risk of stroke. This is more likely to develop in people who have high blood pressure readings, are of African descent, or are overweight.  Have your blood pressure checked: ? Every 3-5 years if you are 18-39 years of age. ? Every year if you are 40 years old or older. Diabetes Have regular diabetes screenings. This checks your fasting blood sugar level. Have the screening done:  Once every three years after age 40 if you are at a normal weight and have a low risk for diabetes.  More often and at a younger age if you are overweight or have a high risk for diabetes. What should I know about preventing infection? Hepatitis B If you have a higher risk for hepatitis B, you should be screened for this virus. Talk with your health care provider to find out if you are at risk for hepatitis B infection. Hepatitis C Testing is recommended for:  Everyone born from 1945 through 1965.  Anyone with known risk factors for hepatitis C. Sexually transmitted infections (STIs)  Get screened for STIs, including gonorrhea and chlamydia, if: ? You are sexually active and are younger than 63 years of age. ? You are older than 63 years of age and your health care provider tells you that you are at risk for this type of infection. ? Your sexual activity has changed since you were last screened, and you are at increased risk for chlamydia or gonorrhea. Ask your health care provider if   you are at risk.  Ask your health care provider about whether you are at high risk for HIV. Your health care provider may recommend a prescription medicine to help prevent HIV infection. If you choose to take medicine to prevent HIV, you should first get tested for HIV. You should then be tested every 3 months for as long as you are taking the medicine. Pregnancy  If you are about to stop having your period (premenopausal) and  you may become pregnant, seek counseling before you get pregnant.  Take 400 to 800 micrograms (mcg) of folic acid every day if you become pregnant.  Ask for birth control (contraception) if you want to prevent pregnancy. Osteoporosis and menopause Osteoporosis is a disease in which the bones lose minerals and strength with aging. This can result in bone fractures. If you are 65 years old or older, or if you are at risk for osteoporosis and fractures, ask your health care provider if you should:  Be screened for bone loss.  Take a calcium or vitamin D supplement to lower your risk of fractures.  Be given hormone replacement therapy (HRT) to treat symptoms of menopause. Follow these instructions at home: Lifestyle  Do not use any products that contain nicotine or tobacco, such as cigarettes, e-cigarettes, and chewing tobacco. If you need help quitting, ask your health care provider.  Do not use street drugs.  Do not share needles.  Ask your health care provider for help if you need support or information about quitting drugs. Alcohol use  Do not drink alcohol if: ? Your health care provider tells you not to drink. ? You are pregnant, may be pregnant, or are planning to become pregnant.  If you drink alcohol: ? Limit how much you use to 0-1 drink a day. ? Limit intake if you are breastfeeding.  Be aware of how much alcohol is in your drink. In the [place] one drink equals one 12 oz bottle of beer (355 mL), one 5 oz glass of wine (148 mL), or one 1 oz glass of hard liquor (44 mL). General instructions  Schedule regular health, dental, and eye exams.  Stay current with your vaccines.  Tell your health care provider if: ? You often feel depressed. ? You have ever been abused or do not feel safe at home. Summary  Adopting a healthy lifestyle and getting preventive care are important in promoting health and wellness.  Follow your health care provider's instructions about healthy  diet, exercising, and getting tested or screened for diseases.  Follow your health care provider's instructions on monitoring your cholesterol and blood pressure. This information is not intended to replace advice given to you by your health care provider. Make sure you discuss any questions you have with your health care provider. Document Released: 10/23/2010 Document Revised: 04/02/2018 Document Reviewed: 04/02/2018 Elsevier Patient Education  2020 [employer]  

## 2019-03-02 NOTE — Progress Notes (Signed)
Subjective:     Joan Yang is a 63 y.o. female and is here for a comprehensive physical exam. The patient reports no problems.  She is doing well overall.  No specific concerns.  Social History   Socioeconomic History  . Marital status: Married    Spouse name: Not on file  . Number of children: Not on file  . Years of education: Not on file  . Highest education level: Not on file  Occupational History  . Not on file  Social Needs  . Financial resource strain: Not on file  . Food insecurity    Worry: Not on file    Inability: Not on file  . Transportation needs    Medical: Not on file    Non-medical: Not on file  Tobacco Use  . Smoking status: Former Research scientist (life sciences)  . Smokeless tobacco: Never Used  Substance and Sexual Activity  . Alcohol use: Not on file  . Drug use: Not on file  . Sexual activity: Not on file  Lifestyle  . Physical activity    Days per week: Not on file    Minutes per session: Not on file  . Stress: Not on file  Relationships  . Social Herbalist on phone: Not on file    Gets together: Not on file    Attends religious service: Not on file    Active member of club or organization: Not on file    Attends meetings of clubs or organizations: Not on file    Relationship status: Not on file  . Intimate partner violence    Fear of current or ex partner: Not on file    Emotionally abused: Not on file    Physically abused: Not on file    Forced sexual activity: Not on file  Other Topics Concern  . Not on file  Social History Narrative  . Not on file   Health Maintenance  Topic Date Due  . HIV Screening  06/15/2020 (Originally 12/26/1970)  . Fecal DNA (Cologuard)  04/12/2019  . MAMMOGRAM  03/26/2020  . PAP SMEAR-Modifier  12/28/2020  . TETANUS/TDAP  03/01/2029  . INFLUENZA VACCINE  Completed  . Hepatitis C Screening  Completed    The following portions of the patient's history were reviewed and updated as appropriate: allergies, current  medications, past family history, past medical history, past social history, past surgical history and problem list.  Review of Systems A comprehensive review of systems was negative.   Objective:    BP (!) 130/56   Pulse 63   Ht 5\' 4"  (1.626 m)   Wt 148 lb (67.1 kg)   SpO2 100%   BMI 25.40 kg/m  General appearance: alert, cooperative and appears stated age Head: Normocephalic, without obvious abnormality, atraumatic Eyes: conj clear, EOMI, PEERLA Ears: normal TM's and external ear canals both ears Nose: Nares normal. Septum midline. Mucosa normal. No drainage or sinus tenderness. Throat: lips, mucosa, and tongue normal; teeth and gums normal Neck: no adenopathy, no carotid bruit, no JVD, supple, symmetrical, trachea midline and thyroid not enlarged, symmetric, no tenderness/mass/nodules Back: symmetric, no curvature. ROM normal. No CVA tenderness. Lungs: clear to auscultation bilaterally Breasts: normal appearance, no masses or tenderness Heart: regular rate and rhythm, S1, S2 normal, no murmur, click, rub or gallop Abdomen: soft, non-tender; bowel sounds normal; no masses,  no organomegaly Extremities: extremities normal, atraumatic, no cyanosis or edema Pulses: 2+ and symmetric Skin: Skin color, texture, turgor normal. No rashes  or lesions Lymph nodes: Cervical, supraclavicular, and axillary nodes normal. Neurologic: Alert and oriented X 3, normal strength and tone. Normal symmetric reflexes. Normal coordination and gait    Assessment:    Healthy female exam.     Plan:     See After Visit Summary for Counseling Recommendations   Keep up a regular exercise program and make sure you are eating a healthy diet Try to eat 4 servings of dairy a day, or if you are lactose intolerant take a calcium with vitamin D daily.  Your vaccines are up to date.  Pap smear is up-to-date. Mammogram has been scheduled. Colon cancer screening/Cologuard due in December. Pap smear up-to-date.   Next due in 2022. Tdap updated today. Received flu vaccine last month.

## 2019-03-02 NOTE — Telephone Encounter (Signed)
Patient advised of recommendations. She states she will call us back after she speaks to her insurance company. The last Cologuard the insurance would not pay.

## 2019-03-02 NOTE — Telephone Encounter (Signed)
Call patient and let her know that she is due for repeat colon cancer screening in December but when she did her Cologuard 3 years ago.  If she like to do Cologuard again then just let us know we will send over a new right hip fat request for kit and have it shipped to her home.  If she would rather do something different like full colonoscopy then please let us know.

## 2019-03-26 DIAGNOSIS — H59039 Cystoid macular edema following cataract surgery, unspecified eye: Secondary | ICD-10-CM | POA: Diagnosis not present

## 2019-03-26 DIAGNOSIS — H5213 Myopia, bilateral: Secondary | ICD-10-CM | POA: Diagnosis not present

## 2019-03-26 DIAGNOSIS — H43813 Vitreous degeneration, bilateral: Secondary | ICD-10-CM | POA: Diagnosis not present

## 2019-03-26 DIAGNOSIS — Z961 Presence of intraocular lens: Secondary | ICD-10-CM | POA: Diagnosis not present

## 2019-04-07 DIAGNOSIS — Z1231 Encounter for screening mammogram for malignant neoplasm of breast: Secondary | ICD-10-CM | POA: Diagnosis not present

## 2019-04-07 LAB — HM MAMMOGRAPHY

## 2019-04-10 ENCOUNTER — Other Ambulatory Visit: Payer: Self-pay | Admitting: Family Medicine

## 2019-04-14 DIAGNOSIS — Z20828 Contact with and (suspected) exposure to other viral communicable diseases: Secondary | ICD-10-CM | POA: Diagnosis not present

## 2019-04-29 DIAGNOSIS — H5213 Myopia, bilateral: Secondary | ICD-10-CM | POA: Diagnosis not present

## 2019-04-29 DIAGNOSIS — H59039 Cystoid macular edema following cataract surgery, unspecified eye: Secondary | ICD-10-CM | POA: Diagnosis not present

## 2019-04-29 DIAGNOSIS — Z961 Presence of intraocular lens: Secondary | ICD-10-CM | POA: Diagnosis not present

## 2019-04-29 DIAGNOSIS — H43813 Vitreous degeneration, bilateral: Secondary | ICD-10-CM | POA: Diagnosis not present

## 2019-05-01 ENCOUNTER — Encounter: Payer: Self-pay | Admitting: Family Medicine

## 2019-05-11 ENCOUNTER — Other Ambulatory Visit (HOSPITAL_COMMUNITY)
Admission: RE | Admit: 2019-05-11 | Discharge: 2019-05-11 | Disposition: A | Discharge status: Home / Self Care | Payer: BC Managed Care – PPO | Source: Ambulatory Visit | Attending: Nurse Practitioner | Admitting: Nurse Practitioner

## 2019-05-11 ENCOUNTER — Ambulatory Visit (INDEPENDENT_AMBULATORY_CARE_PROVIDER_SITE_OTHER): Payer: BC Managed Care – PPO | Admitting: Nurse Practitioner

## 2019-05-11 ENCOUNTER — Encounter: Payer: Self-pay | Admitting: Nurse Practitioner

## 2019-05-11 ENCOUNTER — Other Ambulatory Visit: Payer: Self-pay

## 2019-05-11 VITALS — BP 153/76 | HR 71 | Temp 98.0°F | Resp 14 | Ht 64.0 in | Wt 142.2 lb

## 2019-05-11 DIAGNOSIS — N939 Abnormal uterine and vaginal bleeding, unspecified: Secondary | ICD-10-CM | POA: Insufficient documentation

## 2019-05-11 NOTE — Progress Notes (Signed)
Acute Office Visit  Subjective:    Patient ID: Joan Yang, female    DOB: 1956/02/26, 64 y.o.   MRN: [id number]  CC: Irregular vaginal bleeding  HPI Patient is in today for irregular vaginal bleeding that started this morning. She describes the bleeding as moderate and present when she awoke this morning. She is not currently sexually active. She has not had anything happen like this before. She has been post menopausal for 14 years. She feels the amount of blood has been decreasing as the day has gone on. She also reports a decreased appetite for the past several weeks and unexplained weight loss. She has no history of breast, cervical, or uterine cancers.   VAGINAL BLEEDING Taking Prempro for at least 14 years for hot flashes and menopausal symptoms. Duration: today  Discharge description: brownish red   Pruritus: no Dysuria: no Malodorous: no Urinary frequency: no Fevers: no Abdominal pain: no  Sexual activity: not sexually active History of sexually transmitted diseases: no Recent antibiotic use: no Context: started today  Treatments attempted: none   Past Medical History:  Diagnosis Date  . ankylosis spondylitis 01/28/2007   Qualifier: Diagnosis of  By: Esmeralda Arthur    . Palpitations 01/29/2006   Qualifier: Diagnosis of  By: Valetta Close DO, Karen      No past surgical history on file.  Family History  Problem Relation Age of Onset  . Rheum arthritis Mother   . Congestive Heart Failure Mother   . Hypertension Mother   . Parkinson's disease Father     Social History   Socioeconomic History  . Marital status: Married    Spouse name: Not on file  . Number of children: Not on file  . Years of education: Not on file  . Highest education level: Not on file  Occupational History  . Not on file  Tobacco Use  . Smoking status: Former Research scientist (life sciences)  . Smokeless tobacco: Never Used  Substance and Sexual Activity  . Alcohol use: Not on file  . Drug use: Not on file  . Sexual  activity: Not on file  Other Topics Concern  . Not on file  Social History Narrative  . Not on file   Social Determinants of Health   Financial Resource Strain:   . Difficulty of Paying Living Expenses: Not on file  Food Insecurity:   . Worried About Charity fundraiser in the Last Year: Not on file  . Ran Out of Food in the Last Year: Not on file  Transportation Needs:   . Lack of Transportation (Medical): Not on file  . Lack of Transportation (Non-Medical): Not on file  Physical Activity:   . Days of Exercise per Week: Not on file  . Minutes of Exercise per Session: Not on file  Stress:   . Feeling of Stress : Not on file  Social Connections:   . Frequency of Communication with Friends and Family: Not on file  . Frequency of Social Gatherings with Friends and Family: Not on file  . Attends Religious Services: Not on file  . Active Member of Clubs or Organizations: Not on file  . Attends Archivist Meetings: Not on file  . Marital Status: Not on file  Intimate Partner Violence:   . Fear of Current or Ex-Partner: Not on file  . Emotionally Abused: Not on file  . Physically Abused: Not on file  . Sexually Abused: Not on file    Outpatient Medications Prior to  Visit  Medication Sig Dispense Refill  . HUMIRA PEN 40 MG/0.8ML PNKT Inject 40 mg into the skin every 14 (fourteen) days.  1  . ketorolac (ACULAR) 0.5 % ophthalmic solution Place 1 drop into both eyes 4 (four) times daily.  3  . prednisoLONE acetate (PRED FORTE) 1 % ophthalmic suspension Place 1 drop into both eyes 4 (four) times daily.  3  . PREMPRO 0.45-1.5 MG tablet TAKE 1 TABLET BY MOUTH EVERY DAY 56 tablet 4  . triamterene-hydrochlorothiazide (MAXZIDE-25) 37.5-25 MG tablet TAKE 1 TABLET BY MOUTH EVERY DAY 90 tablet 1   No facility-administered medications prior to visit.    No Known Allergies  Review of Systems  Constitutional: Positive for appetite change, fatigue and unexpected weight change.   Gastrointestinal: Positive for nausea. Negative for abdominal distention, abdominal pain, constipation and diarrhea.  Genitourinary: Positive for vaginal bleeding. Negative for difficulty urinating, dyspareunia, dysuria, flank pain, frequency, genital sores, hematuria, pelvic pain, urgency and vaginal pain.  Musculoskeletal: Negative for back pain and myalgias.       Objective:    Physical Exam Exam conducted with a chaperone present.  Constitutional:      General: She is not in acute distress.    Appearance: Normal appearance.  HENT:     Head: Normocephalic.  Cardiovascular:     Rate and Rhythm: Normal rate and regular rhythm.     Pulses: Normal pulses.     Heart sounds: Normal heart sounds.  Pulmonary:     Effort: Pulmonary effort is normal.     Breath sounds: Normal breath sounds.  Abdominal:     General: Abdomen is flat. Bowel sounds are normal. There is no distension.     Palpations: Abdomen is soft. There is no mass.     Tenderness: There is no abdominal tenderness. There is no guarding.  Genitourinary:    General: Normal vulva.     Exam position: Lithotomy position.     Pubic Area: No rash.      Labia:        Right: No rash, tenderness, lesion or injury.        Left: No rash, tenderness, lesion or injury.      Urethra: No prolapse, urethral swelling or urethral lesion.     Vagina: Vaginal discharge and lesions present. No tenderness.     Cervix: Friability present. No cervical motion tenderness or erythema.     Uterus: Not enlarged and not tender.      Adnexa: Right adnexa normal and left adnexa normal.     Rectum: Normal.  Skin:    General: Skin is warm and dry.     Capillary Refill: Capillary refill takes less than 2 seconds.  Neurological:     General: No focal deficit present.     Mental Status: She is alert and oriented to person, place, and time.  Psychiatric:        Mood and Affect: Mood normal.        Behavior: Behavior normal.     There were no  vitals taken for this visit. Wt Readings from Last 3 Encounters:  03/02/19 148 lb (67.1 kg)  01/12/19 152 lb (68.9 kg)  07/03/18 157 lb (71.2 kg)    Health Maintenance Due  Topic Date Due  . Fecal DNA (Cologuard)  04/12/2019    There are no preventive care reminders to display for this patient.   Lab Results  Component Value Date   TSH 0.21 (L) 06/27/2017  Lab Results  Component Value Date   WBC 7.0 01/12/2019   HGB 14.9 01/12/2019   HCT 44.7 01/12/2019   MCV 81.9 01/12/2019   PLT 297 01/12/2019   Lab Results  Component Value Date   NA 139 01/12/2019   K 3.6 01/12/2019   CO2 27 01/12/2019   GLUCOSE 84 01/12/2019   BUN 15 01/12/2019   CREATININE 1.10 (H) 01/12/2019   BILITOT 0.9 01/12/2019   ALKPHOS 44 08/28/2016   AST 23 01/12/2019   ALT 13 01/12/2019   PROT 7.7 01/12/2019   ALBUMIN 3.9 08/28/2016   CALCIUM 9.3 01/12/2019   Lab Results  Component Value Date   CHOL 238 (A) 05/14/2018   Lab Results  Component Value Date   HDL 95 (A) 05/14/2018   Lab Results  Component Value Date   LDLCALC 129 05/14/2018   Lab Results  Component Value Date   TRIG 72 05/14/2018   Lab Results  Component Value Date   CHOLHDL 3.0 06/27/2017   Lab Results  Component Value Date   HGBA1C 5.3 05/14/2018       Assessment & Plan:   1. Abnormal uterine bleeding PAP testing performed today in the office. Will order transvaginal ultrasound with [facility] (per patient preference) and referral to GYN with [facility] in [facility] (per patient request). Patient instructed to contact the office with new or worsening symptoms including pain, pressure, dizziness, increased bleeding, or a change in bowel habits. Will contact the patient with PAP results.     - Ambulatory referral to Gynecology - Cytology - PAP - US Pelvic Complete With Transvaginal; Future  Tollie Eth, NP

## 2019-05-11 NOTE — Patient Instructions (Signed)
Abnormal Uterine Bleeding °Abnormal uterine bleeding means bleeding more than usual from your uterus. It can include: °· Bleeding between periods. °· Bleeding after sex. °· Bleeding that is heavier than normal. °· Periods that last longer than usual. °· Bleeding after you have stopped having your period (menopause). °There are many problems that may cause this. You should see a doctor for any kind of bleeding that is not normal. Treatment depends on the cause of the bleeding. °Follow these instructions at home: °· Watch your condition for any changes. °· Do not use tampons, douche, or have sex, if your doctor tells you not to. °· Change your pads often. °· Get regular well-woman exams. Make sure they include a pelvic exam and cervical cancer screening. °· Keep all follow-up visits as told by your doctor. This is important. °Contact a doctor if: °· The bleeding lasts more than one week. °· You feel dizzy at times. °· You feel like you are going to throw up (nauseous). °· You throw up. °Get help right away if: °· You pass out. °· You have to change pads every hour. °· You have belly (abdominal) pain. °· You have a fever. °· You get sweaty. °· You get weak. °· You passing large blood clots from your vagina. °Summary °· Abnormal uterine bleeding means bleeding more than usual from your uterus. °· There are many problems that may cause this. You should see a doctor for any kind of bleeding that is not normal. °· Treatment depends on the cause of the bleeding. °This information is not intended to replace advice given to you by your health care provider. Make sure you discuss any questions you have with your health care provider. °Document Revised: 04/03/2016 Document Reviewed: 04/03/2016 °Elsevier Patient Education © 2020 Elsevier Inc. ° °

## 2019-05-12 ENCOUNTER — Telehealth: Payer: Self-pay | Admitting: Family Medicine

## 2019-05-12 LAB — CYTOLOGY - PAP: Diagnosis: NEGATIVE

## 2019-05-12 NOTE — Telephone Encounter (Signed)
[name],    I received a call from [facility] where patient wanted to be seen and they state she is out of their scope they see younger child bearing woman and recommended the referral be sent to [facility] instead. I can change the referral to our [facility] if you would like for me to. Please advise  Arline Asp

## 2019-05-13 DIAGNOSIS — N950 Postmenopausal bleeding: Secondary | ICD-10-CM | POA: Diagnosis not present

## 2019-05-13 DIAGNOSIS — Z79890 Hormone replacement therapy (postmenopausal): Secondary | ICD-10-CM | POA: Diagnosis not present

## 2019-05-13 DIAGNOSIS — D259 Leiomyoma of uterus, unspecified: Secondary | ICD-10-CM | POA: Diagnosis not present

## 2019-05-13 NOTE — Telephone Encounter (Signed)
Sent referral to [facility] they will call and schedule with patient  - CF

## 2019-06-02 DIAGNOSIS — N950 Postmenopausal bleeding: Secondary | ICD-10-CM | POA: Diagnosis not present

## 2019-06-02 DIAGNOSIS — D259 Leiomyoma of uterus, unspecified: Secondary | ICD-10-CM | POA: Diagnosis not present

## 2019-07-15 DIAGNOSIS — Z20828 Contact with and (suspected) exposure to other viral communicable diseases: Secondary | ICD-10-CM | POA: Diagnosis not present

## 2019-07-15 DIAGNOSIS — Z03818 Encounter for observation for suspected exposure to other biological agents ruled out: Secondary | ICD-10-CM | POA: Diagnosis not present

## 2019-07-29 DIAGNOSIS — H59039 Cystoid macular edema following cataract surgery, unspecified eye: Secondary | ICD-10-CM | POA: Diagnosis not present

## 2019-07-29 DIAGNOSIS — H43813 Vitreous degeneration, bilateral: Secondary | ICD-10-CM | POA: Diagnosis not present

## 2019-07-29 DIAGNOSIS — H5213 Myopia, bilateral: Secondary | ICD-10-CM | POA: Diagnosis not present

## 2019-07-29 DIAGNOSIS — Z961 Presence of intraocular lens: Secondary | ICD-10-CM | POA: Diagnosis not present

## 2019-07-31 ENCOUNTER — Other Ambulatory Visit: Payer: Self-pay | Admitting: Family Medicine

## 2019-08-13 LAB — HEMOGLOBIN A1C: Hemoglobin A1C: 5.1

## 2019-08-13 LAB — LIPID PANEL
Cholesterol: 226 — AB (ref 0–200)
HDL: 87 — AB (ref 35–70)
LDL Cholesterol: 128
Triglycerides: 63 (ref 40–160)

## 2019-08-21 DIAGNOSIS — M455 Ankylosing spondylitis of thoracolumbar region: Secondary | ICD-10-CM | POA: Diagnosis not present

## 2019-08-21 DIAGNOSIS — H209 Unspecified iridocyclitis: Secondary | ICD-10-CM | POA: Diagnosis not present

## 2019-08-21 DIAGNOSIS — Z79899 Other long term (current) drug therapy: Secondary | ICD-10-CM | POA: Diagnosis not present

## 2019-08-31 ENCOUNTER — Other Ambulatory Visit: Payer: Self-pay | Admitting: Family Medicine

## 2019-09-01 ENCOUNTER — Encounter: Payer: Self-pay | Admitting: Family Medicine

## 2019-09-01 ENCOUNTER — Other Ambulatory Visit: Payer: Self-pay

## 2019-09-01 ENCOUNTER — Ambulatory Visit (INDEPENDENT_AMBULATORY_CARE_PROVIDER_SITE_OTHER): Payer: BC Managed Care – PPO | Admitting: Family Medicine

## 2019-09-01 VITALS — BP 127/54 | HR 62 | Ht 64.0 in | Wt 152.0 lb

## 2019-09-01 DIAGNOSIS — E0590 Thyrotoxicosis, unspecified without thyrotoxic crisis or storm: Secondary | ICD-10-CM | POA: Diagnosis not present

## 2019-09-01 DIAGNOSIS — Z79899 Other long term (current) drug therapy: Secondary | ICD-10-CM

## 2019-09-01 DIAGNOSIS — K120 Recurrent oral aphthae: Secondary | ICD-10-CM

## 2019-09-01 DIAGNOSIS — M459 Ankylosing spondylitis of unspecified sites in spine: Secondary | ICD-10-CM | POA: Diagnosis not present

## 2019-09-01 DIAGNOSIS — I10 Essential (primary) hypertension: Secondary | ICD-10-CM | POA: Diagnosis not present

## 2019-09-01 DIAGNOSIS — Z1211 Encounter for screening for malignant neoplasm of colon: Secondary | ICD-10-CM

## 2019-09-01 MED ORDER — MAGIC MOUTHWASH: ORAL | 1 refills | Status: DC

## 2019-09-01 NOTE — Progress Notes (Signed)
Established Patient Office Visit  Subjective:  Patient ID: Joan Yang, female    DOB: Sep 24, 1955  Age: 64 y.o. MRN: [id number]  CC:  Chief Complaint  Patient presents with  . Hypertension  . Mouth Lesions    roof of mouth x several days. unsure of cause. no dietary changes,fevers,hard candies or spicey foods    HPI Joan Yang presents for   Hypertension- Pt denies chest pain, SOB, dizziness, or heart palpitations.  Taking meds as directed w/o problems.  Denies medication side effects.    She also reports that she is noticed a couple of lesions on the roof of her mouth for the last several days.  She is not sure what triggered it.  No recent dietary changes.  No fevers chills or sweats or cold symptoms.  She has not been eating any hard candies or particularly spicy foods and no burns to the mouth.  He says she has had them previously in fact she usually will get a few of the sores maybe twice a year.  She also had some postmenopausal spotting and so after evaluation by her GYN they decided to decrease her Prempro to 4 days/week and that seems to be working well and she has not had any recurrent spotting.  She notices a big difference in her sleep quality if she is not taking the hormones.  Her rheumatologist would like some additional labs drawn for her ankylosing spondylitis and so she would like to get all the draw labs drawn together if at all possible.  They are requesting a gold TB test, sed rate, CBC with differential, hepatic function, and renal function.  She would like to go ahead and get her Cologuard ordered as well.  Past Medical History:  Diagnosis Date  . ankylosis spondylitis 01/28/2007   Qualifier: Diagnosis of  By: Esmeralda Arthur    . Palpitations 01/29/2006   Qualifier: Diagnosis of  By: Valetta Close DO, Karen      No past surgical history on file.  Family History  Problem Relation Age of Onset  . Rheum arthritis Mother   . Congestive Heart Failure Mother   .  Hypertension Mother   . Parkinson's disease Father     Social History   Socioeconomic History  . Marital status: Married    Spouse name: Not on file  . Number of children: Not on file  . Years of education: Not on file  . Highest education level: Not on file  Occupational History  . Not on file  Tobacco Use  . Smoking status: Former Research scientist (life sciences)  . Smokeless tobacco: Never Used  Substance and Sexual Activity  . Alcohol use: Not on file  . Drug use: Not on file  . Sexual activity: Not on file  Other Topics Concern  . Not on file  Social History Narrative  . Not on file   Social Determinants of Health   Financial Resource Strain:   . Difficulty of Paying Living Expenses:   Food Insecurity:   . Worried About Charity fundraiser in the Last Year:   . Arboriculturist in the Last Year:   Transportation Needs:   . Film/video editor (Medical):   Marland Kitchen Lack of Transportation (Non-Medical):   Physical Activity:   . Days of Exercise per Week:   . Minutes of Exercise per Session:   Stress:   . Feeling of Stress :   Social Connections:   . Frequency of Communication  with Friends and Family:   . Frequency of Social Gatherings with Friends and Family:   . Attends Religious Services:   . Active Member of Clubs or Organizations:   . Attends Banker Meetings:   Marland Kitchen Marital Status:   Intimate Partner Violence:   . Fear of Current or Ex-Partner:   . Emotionally Abused:   Marland Kitchen Physically Abused:   . Sexually Abused:     Outpatient Medications Prior to Visit  Medication Sig Dispense Refill  . HUMIRA PEN 40 MG/0.8ML PNKT Inject 40 mg into the skin every 14 (fourteen) days.  1  . ketorolac (ACULAR) 0.5 % ophthalmic solution Place 1 drop into both eyes 4 (four) times daily.  3  . prednisoLONE acetate (PRED FORTE) 1 % ophthalmic suspension Place 1 drop into both eyes 4 (four) times daily.  3  . PREMPRO 0.45-1.5 MG tablet TAKE 1 TABLET BY MOUTH EVERY DAY 56 tablet 4  .  triamterene-hydrochlorothiazide (MAXZIDE-25) 37.5-25 MG tablet Take 1 tablet by mouth daily. 90 tablet 1   No facility-administered medications prior to visit.    No Known Allergies  ROS Review of Systems    Objective:    Physical Exam  Constitutional: She is oriented to person, place, and time. She appears well-developed and well-nourished.  HENT:  Head: Normocephalic and atraumatic.  Oral ulcer on the side of her tongue  Cardiovascular: Normal rate, regular rhythm and normal heart sounds.  Pulmonary/Chest: Effort normal and breath sounds normal.  Neurological: She is alert and oriented to person, place, and time.  Skin: Skin is warm and dry.  Psychiatric: She has a normal mood and affect. Her behavior is normal.    BP (!) 127/54   Pulse 62   Ht 5\' 4"  (1.626 m)   Wt 152 lb (68.9 kg)   SpO2 100%   BMI 26.09 kg/m  Wt Readings from Last 3 Encounters:  09/01/19 152 lb (68.9 kg)  05/11/19 142 lb 3.2 oz (64.5 kg)  03/02/19 148 lb (67.1 kg)     There are no preventive care reminders to display for this patient.  There are no preventive care reminders to display for this patient.  Lab Results  Component Value Date   TSH 0.22 (L) 09/01/2019   Lab Results  Component Value Date   WBC 6.4 09/01/2019   HGB 14.0 09/01/2019   HCT 42.4 09/01/2019   MCV 84.0 09/01/2019   PLT 245 09/01/2019   Lab Results  Component Value Date   NA 139 09/01/2019   K 3.9 09/01/2019   CO2 29 09/01/2019   GLUCOSE 80 09/01/2019   BUN 18 09/01/2019   CREATININE 0.99 09/01/2019   BILITOT 0.7 09/01/2019   ALKPHOS 44 08/28/2016   AST 23 09/01/2019   ALT 15 09/01/2019   PROT 7.4 09/01/2019   ALBUMIN 3.9 08/28/2016   CALCIUM 9.1 09/01/2019   Lab Results  Component Value Date   CHOL 236 (H) 09/01/2019   Lab Results  Component Value Date   HDL 87 09/01/2019   Lab Results  Component Value Date   LDLCALC 134 (H) 09/01/2019   Lab Results  Component Value Date   TRIG 48 09/01/2019    Lab Results  Component Value Date   CHOLHDL 2.7 09/01/2019   Lab Results  Component Value Date   HGBA1C 5.3 05/14/2018      Assessment & Plan:   Problem List Items Addressed This Visit      Cardiovascular and Mediastinum  HYPERTENSION, BENIGN SYSTEMIC - Primary    Well controlled. Continue current regimen. Follow up in  6 mo      Relevant Orders   Lipid panel (Completed)   CBC with Differential/Platelet (Completed)   Sedimentation rate (Completed)   BASIC METABOLIC PANEL WITH GFR (Completed)   Hepatic function panel (Completed)   QuantiFERON-TB Gold Plus   TSH (Completed)     Digestive   Oral aphthous ulcer    Recommend a trial  Of Lysine if recurring frequently along with salt water gargles, and Maalox applied to lesions.       Relevant Medications   magic mouthwash SOLN     Endocrine   Subclinical hyperthyroidism (Chronic)    Due to recheck thyroid      Relevant Orders   Lipid panel (Completed)   CBC with Differential/Platelet (Completed)   Sedimentation rate (Completed)   BASIC METABOLIC PANEL WITH GFR (Completed)   Hepatic function panel (Completed)   QuantiFERON-TB Gold Plus   TSH (Completed)     Musculoskeletal and Integument   Ankylosing spondylitis (HCC) (Chronic)    Will get additional labs for Rheum including TB Gold interferon.       Relevant Orders   Lipid panel (Completed)   CBC with Differential/Platelet (Completed)   Sedimentation rate (Completed)   BASIC METABOLIC PANEL WITH GFR (Completed)   Hepatic function panel (Completed)   QuantiFERON-TB Gold Plus   TSH (Completed)     Other   High risk medication use   Relevant Orders   Lipid panel (Completed)   CBC with Differential/Platelet (Completed)   Sedimentation rate (Completed)   BASIC METABOLIC PANEL WITH GFR (Completed)   Hepatic function panel (Completed)   QuantiFERON-TB Gold Plus   TSH (Completed)    Other Visit Diagnoses    Screen for colon cancer       Relevant  Orders   Cologuard      Meds ordered this encounter  Medications  . magic mouthwash SOLN    Sig: Diphenhydramine 12.5 mg/5 mL 240 mL Hydrocortisone 60 mg Nystatin powder 6 million units Tetracycline 1.5 g Swish and spit 5 mL QID for 1 weeks    Dispense:  150 mL    Refill:  1    Follow-up: No follow-ups on file.    Nani Gasser, MD

## 2019-09-02 ENCOUNTER — Telehealth: Payer: Self-pay | Admitting: Family Medicine

## 2019-09-02 ENCOUNTER — Telehealth: Payer: Self-pay

## 2019-09-02 NOTE — Telephone Encounter (Signed)
The pharmacy states they only have the Nystatin in a suspension.   Diphenhydramine 12.5 mg/5 mL 240 mL  Hydrocortisone 60 mg  Nystatin powder 6 million units  Tetracycline 1.5 g  Swish and spit 5 mL QID for 1 weeks

## 2019-09-02 NOTE — Telephone Encounter (Signed)
Please call pt for dates of her COVID vaccine.

## 2019-09-02 NOTE — Telephone Encounter (Signed)
Ramanda states she has not had the COVID vaccine.

## 2019-09-03 LAB — BASIC METABOLIC PANEL WITH GFR
BUN: 18 mg/dL (ref 7–25)
CO2: 29 mmol/L (ref 20–32)
Calcium: 9.1 mg/dL (ref 8.6–10.4)
Chloride: 103 mmol/L (ref 98–110)
Creat: 0.99 mg/dL (ref 0.50–0.99)
GFR, Est African American: 70 mL/min/{1.73_m2} (ref >=60)
GFR, Est Non African American: 61 mL/min/{1.73_m2} (ref >=60)
Glucose, Bld: 80 mg/dL (ref 65–99)
Potassium: 3.9 mmol/L (ref 3.5–5.3)
Sodium: 139 mmol/L (ref 135–146)

## 2019-09-03 LAB — LIPID PANEL
Cholesterol: 236 mg/dL — ABNORMAL HIGH (ref <200)
HDL: 87 mg/dL (ref >=50)
LDL Cholesterol (Calc): 134 mg/dL (calc) — ABNORMAL HIGH
Non-HDL Cholesterol (Calc): 149 mg/dL (calc) — ABNORMAL HIGH (ref <130)
Total CHOL/HDL Ratio: 2.7 (calc) (ref <5.0)
Triglycerides: 48 mg/dL (ref <150)

## 2019-09-03 LAB — HEPATIC FUNCTION PANEL
AG Ratio: 1.2 (calc) (ref 1.0–2.5)
ALT: 15 U/L (ref 6–29)
AST: 23 U/L (ref 10–35)
Albumin: 4 g/dL (ref 3.6–5.1)
Alkaline phosphatase (APISO): 57 U/L (ref 37–153)
Bilirubin, Direct: 0.1 mg/dL (ref 0.0–0.2)
Globulin: 3.4 g/dL (calc) (ref 1.9–3.7)
Indirect Bilirubin: 0.6 mg/dL (calc) (ref 0.2–1.2)
Total Bilirubin: 0.7 mg/dL (ref 0.2–1.2)
Total Protein: 7.4 g/dL (ref 6.1–8.1)

## 2019-09-03 LAB — CBC WITH DIFFERENTIAL/PLATELET
Absolute Monocytes: 570 cells/uL (ref 200–950)
Basophils Absolute: 32 cells/uL (ref 0–200)
Basophils Relative: 0.5 %
Eosinophils Absolute: 58 cells/uL (ref 15–500)
Eosinophils Relative: 0.9 %
HCT: 42.4 % (ref 35.0–45.0)
Hemoglobin: 14 g/dL (ref 11.7–15.5)
Lymphs Abs: 1850 cells/uL (ref 850–3900)
MCH: 27.7 pg (ref 27.0–33.0)
MCHC: 33 g/dL (ref 32.0–36.0)
MCV: 84 fL (ref 80.0–100.0)
MPV: 10.5 fL (ref 7.5–12.5)
Monocytes Relative: 8.9 %
Neutro Abs: 3891 cells/uL (ref 1500–7800)
Neutrophils Relative %: 60.8 %
Platelets: 245 10*3/uL (ref 140–400)
RBC: 5.05 10*6/uL (ref 3.80–5.10)
RDW: 12.6 % (ref 11.0–15.0)
Total Lymphocyte: 28.9 %
WBC: 6.4 10*3/uL (ref 3.8–10.8)

## 2019-09-03 LAB — SEDIMENTATION RATE: Sed Rate: 22 mm/h (ref 0–30)

## 2019-09-03 LAB — TSH: TSH: 0.22 mIU/L — ABNORMAL LOW (ref 0.40–4.50)

## 2019-09-03 NOTE — Telephone Encounter (Signed)
Okay, that is fine.  Can they use the nystatin suspension instead of the powder to still make the same mixture.

## 2019-09-03 NOTE — Telephone Encounter (Signed)
Patient advised.

## 2019-09-03 NOTE — Telephone Encounter (Signed)
Joan Yang with the pharmacy advised.

## 2019-09-07 ENCOUNTER — Encounter: Payer: Self-pay | Admitting: Family Medicine

## 2019-09-07 NOTE — Assessment & Plan Note (Signed)
Due to recheck thyroid.  

## 2019-09-07 NOTE — Assessment & Plan Note (Signed)
Recommend a trial  Of Lysine if recurring frequently along with salt water gargles, and Maalox applied to lesions.

## 2019-09-07 NOTE — Assessment & Plan Note (Signed)
Will get additional labs for Rheum including TB Gold interferon.

## 2019-09-07 NOTE — Assessment & Plan Note (Signed)
Well controlled. Continue current regimen. Follow up in  6 mo  

## 2019-10-30 DIAGNOSIS — H5213 Myopia, bilateral: Secondary | ICD-10-CM | POA: Diagnosis not present

## 2019-10-30 DIAGNOSIS — H59033 Cystoid macular edema following cataract surgery, bilateral: Secondary | ICD-10-CM | POA: Diagnosis not present

## 2019-10-30 DIAGNOSIS — Z961 Presence of intraocular lens: Secondary | ICD-10-CM | POA: Diagnosis not present

## 2019-10-30 DIAGNOSIS — H43813 Vitreous degeneration, bilateral: Secondary | ICD-10-CM | POA: Diagnosis not present

## 2019-12-02 ENCOUNTER — Encounter: Payer: Self-pay | Admitting: Family Medicine

## 2020-01-29 DIAGNOSIS — Z961 Presence of intraocular lens: Secondary | ICD-10-CM | POA: Diagnosis not present

## 2020-01-29 DIAGNOSIS — H59033 Cystoid macular edema following cataract surgery, bilateral: Secondary | ICD-10-CM | POA: Diagnosis not present

## 2020-01-29 DIAGNOSIS — H43813 Vitreous degeneration, bilateral: Secondary | ICD-10-CM | POA: Diagnosis not present

## 2020-01-29 DIAGNOSIS — H5213 Myopia, bilateral: Secondary | ICD-10-CM | POA: Diagnosis not present

## 2020-02-29 DIAGNOSIS — M455 Ankylosing spondylitis of thoracolumbar region: Secondary | ICD-10-CM | POA: Diagnosis not present

## 2020-02-29 DIAGNOSIS — Z79899 Other long term (current) drug therapy: Secondary | ICD-10-CM | POA: Diagnosis not present

## 2020-02-29 DIAGNOSIS — H209 Unspecified iridocyclitis: Secondary | ICD-10-CM | POA: Diagnosis not present

## 2020-03-03 ENCOUNTER — Ambulatory Visit (INDEPENDENT_AMBULATORY_CARE_PROVIDER_SITE_OTHER): Payer: BC Managed Care – PPO | Admitting: Family Medicine

## 2020-03-03 ENCOUNTER — Other Ambulatory Visit: Payer: Self-pay

## 2020-03-03 VITALS — BP 115/54 | HR 72 | Ht 60.0 in | Wt 149.0 lb

## 2020-03-03 DIAGNOSIS — I10 Essential (primary) hypertension: Secondary | ICD-10-CM

## 2020-03-03 DIAGNOSIS — Z23 Encounter for immunization: Secondary | ICD-10-CM

## 2020-03-03 MED ORDER — HYDROCHLOROTHIAZIDE 25 MG PO TABS: 25.0000 mg | ORAL_TABLET | Freq: Every day | ORAL | 1 refills | Status: DC

## 2020-03-03 NOTE — Progress Notes (Signed)
Established Patient Office Visit  Subjective:  Patient ID: Joan Yang, female    DOB: [birth year]  Age: 64 y.o. MRN: [id number]  CC:  Chief Complaint  Patient presents with  . Hypertension    HPI Joan Yang presents for Pt reports that when she was seen at the rheumatologist office her diastolic was in the upper 40's the nurse asked her if she was feeling ok or had any sxs of low bp she stated that she has not and feels/feeling fine.   She said that she has noticed that her readings have been low but not like that they usually run around the 60's.   She has been on the Triamterene-HCTZ  37.5-25 since 08/05/2012.   She denies any swelling or chest pain or other symptoms.  She is really been working diligently on weight loss and in fact she is down at least 10 pounds since August she said she really got up to about 170 pounds.  So she is really down a little over 20 pounds total her goal is to get down to 140 pounds she says she is just really cleaned up her diet and cut out some of the junk food she was eating.  She says she never got her Cologuard box to do colon cancer screening she has scheduled her mammogram already for next month.  Past Medical History:  Diagnosis Date  . ankylosis spondylitis 01/28/2007   Qualifier: Diagnosis of  By: Esmeralda Arthur    . Palpitations 01/29/2006   Qualifier: Diagnosis of  By: Valetta Close DO, Karen      No past surgical history on file.  Family History  Problem Relation Age of Onset  . Rheum arthritis Mother   . Congestive Heart Failure Mother   . Hypertension Mother   . Parkinson's disease Father     Social History   Socioeconomic History  . Marital status: Married    Spouse name: Not on file  . Number of children: Not on file  . Years of education: Not on file  . Highest education level: Not on file  Occupational History  . Not on file  Tobacco Use  . Smoking status: Former Research scientist (life sciences)  . Smokeless tobacco: Never Used  Substance  and Sexual Activity  . Alcohol use: Not on file  . Drug use: Not on file  . Sexual activity: Not on file  Other Topics Concern  . Not on file  Social History Narrative  . Not on file   Social Determinants of Health   Financial Resource Strain:   . Difficulty of Paying Living Expenses: Not on file  Food Insecurity:   . Worried About Charity fundraiser in the Last Year: Not on file  . Ran Out of Food in the Last Year: Not on file  Transportation Needs:   . Lack of Transportation (Medical): Not on file  . Lack of Transportation (Non-Medical): Not on file  Physical Activity:   . Days of Exercise per Week: Not on file  . Minutes of Exercise per Session: Not on file  Stress:   . Feeling of Stress : Not on file  Social Connections:   . Frequency of Communication with Friends and Family: Not on file  . Frequency of Social Gatherings with Friends and Family: Not on file  . Attends Religious Services: Not on file  . Active Member of Clubs or Organizations: Not on file  . Attends Archivist Meetings: Not on file  .  Marital Status: Not on file  Intimate Partner Violence:   . Fear of Current or Ex-Partner: Not on file  . Emotionally Abused: Not on file  . Physically Abused: Not on file  . Sexually Abused: Not on file    Outpatient Medications Prior to Visit  Medication Sig Dispense Refill  . HUMIRA PEN 40 MG/0.8ML PNKT Inject 40 mg into the skin every 14 (fourteen) days.  1  . PREMPRO 0.45-1.5 MG tablet TAKE 1 TABLET BY MOUTH EVERY DAY 56 tablet 4  . triamterene-hydrochlorothiazide (MAXZIDE-25) 37.5-25 MG tablet Take 1 tablet by mouth daily. 90 tablet 1  . ketorolac (ACULAR) 0.5 % ophthalmic solution Place 1 drop into both eyes 4 (four) times daily.  3  . magic mouthwash SOLN Diphenhydramine 12.5 mg/5 mL 240 mL Hydrocortisone 60 mg Nystatin powder 6 million units Tetracycline 1.5 g Swish and spit 5 mL QID for 1 weeks 150 mL 1  . prednisoLONE acetate (PRED FORTE) 1 %  ophthalmic suspension Place 1 drop into both eyes 4 (four) times daily.  3   No facility-administered medications prior to visit.    No Known Allergies  ROS Review of Systems    Objective:    Physical Exam Constitutional:      Appearance: She is well-developed.  HENT:     Head: Normocephalic and atraumatic.  Cardiovascular:     Rate and Rhythm: Normal rate and regular rhythm.     Heart sounds: Normal heart sounds.  Pulmonary:     Effort: Pulmonary effort is normal.     Breath sounds: Normal breath sounds.  Skin:    General: Skin is warm and dry.  Neurological:     Mental Status: She is alert and oriented to person, place, and time.  Psychiatric:        Behavior: Behavior normal.     BP (!) 115/54   Pulse 72   Ht 5' (1.524 m)   Wt 149 lb (67.6 kg)   SpO2 100%   BMI 29.10 kg/m  Wt Readings from Last 3 Encounters:  03/03/20 149 lb (67.6 kg)  12/02/19 157 lb 14.4 oz (71.6 kg)  09/01/19 152 lb (68.9 kg)     There are no preventive care reminders to display for this patient.  There are no preventive care reminders to display for this patient.  Lab Results  Component Value Date   TSH 0.22 (L) 09/01/2019   Lab Results  Component Value Date   WBC 6.4 09/01/2019   HGB 14.0 09/01/2019   HCT 42.4 09/01/2019   MCV 84.0 09/01/2019   PLT 245 09/01/2019   Lab Results  Component Value Date   NA 139 09/01/2019   K 3.9 09/01/2019   CO2 29 09/01/2019   GLUCOSE 80 09/01/2019   BUN 18 09/01/2019   CREATININE 0.99 09/01/2019   BILITOT 0.7 09/01/2019   ALKPHOS 44 08/28/2016   AST 23 09/01/2019   ALT 15 09/01/2019   PROT 7.4 09/01/2019   ALBUMIN 3.9 08/28/2016   CALCIUM 9.1 09/01/2019   Lab Results  Component Value Date   CHOL 236 (H) 09/01/2019   Lab Results  Component Value Date   HDL 87 09/01/2019   Lab Results  Component Value Date   LDLCALC 134 (H) 09/01/2019   Lab Results  Component Value Date   TRIG 48 09/01/2019   Lab Results  Component  Value Date   CHOLHDL 2.7 09/01/2019   Lab Results  Component Value Date   HGBA1C 5.1  08/13/2019      Assessment & Plan:   Problem List Items Addressed This Visit      Cardiovascular and Mediastinum   HYPERTENSION, BENIGN SYSTEMIC - Primary    Pressure looks phenomenal with anything it is a little on the low end so we discussed decreasing her blood pressure medication she is currently on Maxide and is on the lowest dose.  But we do have room to split the pill apart.  We will switch to hydrochlorothiazide 25 mg and discontinue the triamterene component.  Encouraged her to keep an eye on her blood pressure she goes to her rheumatologist pretty regularly otherwise I will see her back in 6 months she is due for BMP today.      Relevant Medications   hydrochlorothiazide (HYDRODIURIL) 25 MG tablet   Other Relevant Orders   BASIC METABOLIC PANEL WITH GFR    Other Visit Diagnoses    Need for immunization against influenza       Relevant Orders   Flu Vaccine QUAD 36+ mos IM (Completed)      Colon cancer screening-we did order her Cologuard last spring but unfortunately she is never received the kit so we will try to refax it and get in contact with them today so that she can get a kit sent out.   Meds ordered this encounter  Medications  . hydrochlorothiazide (HYDRODIURIL) 25 MG tablet    Sig: Take 1 tablet (25 mg total) by mouth daily.    Dispense:  90 tablet    Refill:  1    Follow-up: Return in about 6 months (around 08/31/2020) for Hypertension.    Beatrice Lecher, MD

## 2020-03-03 NOTE — Assessment & Plan Note (Signed)
Pressure looks phenomenal with anything it is a little on the low end so we discussed decreasing her blood pressure medication she is currently on Maxide and is on the lowest dose.  But we do have room to split the pill apart.  We will switch to hydrochlorothiazide 25 mg and discontinue the triamterene component.  Encouraged her to keep an eye on her blood pressure she goes to her rheumatologist pretty regularly otherwise I will see her back in 6 months she is due for BMP today.

## 2020-03-03 NOTE — Progress Notes (Signed)
Pt reports that when she was seen at the rheumatologist office her diastolic was in the upper 40's the nurse asked her if she was feeling ok or had any sxs of low bp she stated that she has not and feels/feeling fine.   She said that she has noticed that her readings have been low but not like that they usually run around the 60's.   She has been on the Triamterene-HCTZ  37.5-25 since 08/05/2012.

## 2020-03-12 ENCOUNTER — Other Ambulatory Visit: Payer: Self-pay | Admitting: Family Medicine

## 2020-04-11 DIAGNOSIS — Z79899 Other long term (current) drug therapy: Secondary | ICD-10-CM | POA: Diagnosis not present

## 2020-04-11 DIAGNOSIS — Z1231 Encounter for screening mammogram for malignant neoplasm of breast: Secondary | ICD-10-CM | POA: Diagnosis not present

## 2020-04-11 DIAGNOSIS — I10 Essential (primary) hypertension: Secondary | ICD-10-CM | POA: Diagnosis not present

## 2020-04-11 DIAGNOSIS — M455 Ankylosing spondylitis of thoracolumbar region: Secondary | ICD-10-CM | POA: Diagnosis not present

## 2020-04-11 DIAGNOSIS — H209 Unspecified iridocyclitis: Secondary | ICD-10-CM | POA: Diagnosis not present

## 2020-04-11 LAB — HM MAMMOGRAPHY

## 2020-04-12 DIAGNOSIS — H43813 Vitreous degeneration, bilateral: Secondary | ICD-10-CM | POA: Diagnosis not present

## 2020-04-12 DIAGNOSIS — Z961 Presence of intraocular lens: Secondary | ICD-10-CM | POA: Diagnosis not present

## 2020-04-12 DIAGNOSIS — H59033 Cystoid macular edema following cataract surgery, bilateral: Secondary | ICD-10-CM | POA: Diagnosis not present

## 2020-04-12 DIAGNOSIS — H5213 Myopia, bilateral: Secondary | ICD-10-CM | POA: Diagnosis not present

## 2020-04-12 LAB — BASIC METABOLIC PANEL WITH GFR
BUN/Creatinine Ratio: 16 (calc) (ref 6–22)
BUN: 17 mg/dL (ref 7–25)
CO2: 29 mmol/L (ref 20–32)
Calcium: 9.2 mg/dL (ref 8.6–10.4)
Chloride: 102 mmol/L (ref 98–110)
Creat: 1.09 mg/dL — ABNORMAL HIGH (ref 0.50–0.99)
GFR, Est African American: 62 mL/min/{1.73_m2} (ref >=60)
GFR, Est Non African American: 54 mL/min/{1.73_m2} — ABNORMAL LOW (ref >=60)
Glucose, Bld: 81 mg/dL (ref 65–99)
Potassium: 3.6 mmol/L (ref 3.5–5.3)
Sodium: 141 mmol/L (ref 135–146)

## 2020-04-12 NOTE — Progress Notes (Signed)
All labs are normal. 

## 2020-06-05 DIAGNOSIS — R202 Paresthesia of skin: Secondary | ICD-10-CM | POA: Diagnosis not present

## 2020-06-05 DIAGNOSIS — I10 Essential (primary) hypertension: Secondary | ICD-10-CM | POA: Diagnosis not present

## 2020-06-05 DIAGNOSIS — M4802 Spinal stenosis, cervical region: Secondary | ICD-10-CM | POA: Diagnosis not present

## 2020-06-05 DIAGNOSIS — R911 Solitary pulmonary nodule: Secondary | ICD-10-CM | POA: Diagnosis not present

## 2020-06-05 DIAGNOSIS — Z87891 Personal history of nicotine dependence: Secondary | ICD-10-CM | POA: Diagnosis not present

## 2020-06-05 DIAGNOSIS — R079 Chest pain, unspecified: Secondary | ICD-10-CM | POA: Diagnosis not present

## 2020-06-05 DIAGNOSIS — M542 Cervicalgia: Secondary | ICD-10-CM | POA: Diagnosis not present

## 2020-06-05 DIAGNOSIS — Z79899 Other long term (current) drug therapy: Secondary | ICD-10-CM | POA: Diagnosis not present

## 2020-06-05 DIAGNOSIS — R918 Other nonspecific abnormal finding of lung field: Secondary | ICD-10-CM | POA: Diagnosis not present

## 2020-06-05 DIAGNOSIS — R7989 Other specified abnormal findings of blood chemistry: Secondary | ICD-10-CM | POA: Diagnosis not present

## 2020-06-05 DIAGNOSIS — M47812 Spondylosis without myelopathy or radiculopathy, cervical region: Secondary | ICD-10-CM | POA: Diagnosis not present

## 2020-06-05 DIAGNOSIS — M5031 Other cervical disc degeneration,  high cervical region: Secondary | ICD-10-CM | POA: Diagnosis not present

## 2020-06-05 DIAGNOSIS — R072 Precordial pain: Secondary | ICD-10-CM | POA: Diagnosis not present

## 2020-06-05 DIAGNOSIS — M25519 Pain in unspecified shoulder: Secondary | ICD-10-CM | POA: Diagnosis not present

## 2020-06-05 DIAGNOSIS — M2578 Osteophyte, vertebrae: Secondary | ICD-10-CM | POA: Diagnosis not present

## 2020-06-05 DIAGNOSIS — R0789 Other chest pain: Secondary | ICD-10-CM | POA: Diagnosis not present

## 2020-06-05 DIAGNOSIS — R42 Dizziness and giddiness: Secondary | ICD-10-CM | POA: Diagnosis not present

## 2020-06-30 ENCOUNTER — Encounter: Payer: Self-pay | Admitting: Family Medicine

## 2020-08-31 ENCOUNTER — Other Ambulatory Visit: Payer: Self-pay

## 2020-08-31 ENCOUNTER — Ambulatory Visit (INDEPENDENT_AMBULATORY_CARE_PROVIDER_SITE_OTHER): Payer: BC Managed Care – PPO | Admitting: Family Medicine

## 2020-08-31 ENCOUNTER — Encounter: Payer: Self-pay | Admitting: Family Medicine

## 2020-08-31 VITALS — BP 118/52 | HR 64 | Ht 60.0 in | Wt 152.0 lb

## 2020-08-31 DIAGNOSIS — N951 Menopausal and female climacteric states: Secondary | ICD-10-CM | POA: Diagnosis not present

## 2020-08-31 DIAGNOSIS — E0590 Thyrotoxicosis, unspecified without thyrotoxic crisis or storm: Secondary | ICD-10-CM | POA: Diagnosis not present

## 2020-08-31 DIAGNOSIS — I10 Essential (primary) hypertension: Secondary | ICD-10-CM

## 2020-08-31 DIAGNOSIS — Z1211 Encounter for screening for malignant neoplasm of colon: Secondary | ICD-10-CM

## 2020-08-31 MED ORDER — PREMPRO 0.45-1.5 MG PO TABS: 1.0000 | ORAL_TABLET | Freq: Every day | ORAL | 4 refills | Status: DC

## 2020-08-31 MED ORDER — HYDROCHLOROTHIAZIDE 25 MG PO TABS: 25.0000 mg | ORAL_TABLET | Freq: Every day | ORAL | 1 refills | Status: DC

## 2020-08-31 NOTE — Assessment & Plan Note (Signed)
Well controlled. Continue current regimen. Follow up in  6 mo  

## 2020-08-31 NOTE — Progress Notes (Signed)
Established Patient Office Visit  Subjective:  Patient ID: Joan Yang, female    DOB: 12-21-1955  Age: 65 y.o. MRN: [id number]  CC:  Chief Complaint  Patient presents with  . Hypertension    HPI Joan Yang presents for   Hypertension- Pt denies chest pain, SOB, dizziness, or heart palpitations.  Taking meds as directed w/o problems.  Denies medication side effects.    Taking Prempro for HRT now 4 days per week.  She tried to come completely off and didn't do well so has reduced her dose.    Says she never received her Cologuard.    Past Medical History:  Diagnosis Date  . ankylosis spondylitis 01/28/2007   Qualifier: Diagnosis of  By: Thomos Lemons    . Palpitations 01/29/2006   Qualifier: Diagnosis of  By: Thomos Lemons      History reviewed. No pertinent surgical history.  Family History  Problem Relation Age of Onset  . Rheum arthritis Mother   . Congestive Heart Failure Mother   . Hypertension Mother   . Parkinson's disease Father     Social History   Socioeconomic History  . Marital status: Married    Spouse name: Not on file  . Number of children: Not on file  . Years of education: Not on file  . Highest education level: Not on file  Occupational History  . Not on file  Tobacco Use  . Smoking status: Former Games developer  . Smokeless tobacco: Never Used  Substance and Sexual Activity  . Alcohol use: Not on file  . Drug use: Not on file  . Sexual activity: Not on file  Other Topics Concern  . Not on file  Social History Narrative  . Not on file   Social Determinants of Health   Financial Resource Strain: Not on file  Food Insecurity: Not on file  Transportation Needs: Not on file  Physical Activity: Not on file  Stress: Not on file  Social Connections: Not on file  Intimate Partner Violence: Not on file    Outpatient Medications Prior to Visit  Medication Sig Dispense Refill  . HUMIRA PEN 40 MG/0.8ML PNKT Inject 40 mg into the skin every  14 (fourteen) days.  1  . hydrochlorothiazide (HYDRODIURIL) 25 MG tablet Take 1 tablet (25 mg total) by mouth daily. 90 tablet 1  . PREMPRO 0.45-1.5 MG tablet TAKE 1 TABLET BY MOUTH EVERY DAY 56 tablet 4   No facility-administered medications prior to visit.    No Known Allergies  ROS Review of Systems    Objective:    Physical Exam Constitutional:      Appearance: She is well-developed.  HENT:     Head: Normocephalic and atraumatic.  Cardiovascular:     Rate and Rhythm: Normal rate and regular rhythm.     Heart sounds: Normal heart sounds.  Pulmonary:     Effort: Pulmonary effort is normal.     Breath sounds: Normal breath sounds.  Skin:    General: Skin is warm and dry.  Neurological:     Mental Status: She is alert and oriented to person, place, and time.  Psychiatric:        Behavior: Behavior normal.     BP (!) 118/52   Pulse 64   Ht 5' (1.524 m)   Wt 152 lb (68.9 kg)   SpO2 100%   BMI 29.69 kg/m  Wt Readings from Last 3 Encounters:  08/31/20 152 lb (68.9 kg)  03/03/20 149 lb (67.6 kg)  12/02/19 157 lb 14.4 oz (71.6 kg)     Health Maintenance Due  Topic Date Due  . HIV Screening  Never done    There are no preventive care reminders to display for this patient.  Lab Results  Component Value Date   TSH 0.22 (L) 09/01/2019   Lab Results  Component Value Date   WBC 6.4 09/01/2019   HGB 14.0 09/01/2019   HCT 42.4 09/01/2019   MCV 84.0 09/01/2019   PLT 245 09/01/2019   Lab Results  Component Value Date   NA 141 04/11/2020   K 3.6 04/11/2020   CO2 29 04/11/2020   GLUCOSE 81 04/11/2020   BUN 17 04/11/2020   CREATININE 1.09 (H) 04/11/2020   BILITOT 0.7 09/01/2019   ALKPHOS 44 08/28/2016   AST 23 09/01/2019   ALT 15 09/01/2019   PROT 7.4 09/01/2019   ALBUMIN 3.9 08/28/2016   CALCIUM 9.2 04/11/2020   Lab Results  Component Value Date   CHOL 236 (H) 09/01/2019   Lab Results  Component Value Date   HDL 87 09/01/2019   Lab Results   Component Value Date   LDLCALC 134 (H) 09/01/2019   Lab Results  Component Value Date   TRIG 48 09/01/2019   Lab Results  Component Value Date   CHOLHDL 2.7 09/01/2019   Lab Results  Component Value Date   HGBA1C 5.1 08/13/2019      Assessment & Plan:   Problem List Items Addressed This Visit      Cardiovascular and Mediastinum   HYPERTENSION, BENIGN SYSTEMIC - Primary    Well controlled. Continue current regimen. Follow up in  6 mo       Relevant Medications   hydrochlorothiazide (HYDRODIURIL) 25 MG tablet   Other Relevant Orders   Lipid Panel w/reflex Direct LDL   TSH   BASIC METABOLIC PANEL WITH GFR     Endocrine   Subclinical hyperthyroidism (Chronic)    Check TSH.      Relevant Orders   Lipid Panel w/reflex Direct LDL   TSH   BASIC METABOLIC PANEL WITH GFR     Other   POSTMENOPAUSAL STATUS    Currently on hormone replacement therapy.  Decreased her estrogen down to 4 days a week which I think is great we discussed staying on that for maybe about 6 months and then trying to go down to 3 days a week and gradually weaning so that she does not get a significant bump and hot flashes.       Other Visit Diagnoses    Screen for colon cancer       Relevant Orders   Cologuard      Meds ordered this encounter  Medications  . hydrochlorothiazide (HYDRODIURIL) 25 MG tablet    Sig: Take 1 tablet (25 mg total) by mouth daily.    Dispense:  90 tablet    Refill:  1  . estrogen, conjugated,-medroxyprogesterone (PREMPRO) 0.45-1.5 MG tablet    Sig: Take 1 tablet by mouth daily.    Dispense:  56 tablet    Refill:  4    Follow-up: Return in about 6 months (around 03/03/2021) for Hypertension.    Nani Gasser, MD

## 2020-08-31 NOTE — Assessment & Plan Note (Signed)
Currently on hormone replacement therapy.  Decreased her estrogen down to 4 days a week which I think is great we discussed staying on that for maybe about 6 months and then trying to go down to 3 days a week and gradually weaning so that she does not get a significant bump and hot flashes.

## 2020-08-31 NOTE — Assessment & Plan Note (Signed)
Check TSH 

## 2020-10-18 DIAGNOSIS — D2361 Other benign neoplasm of skin of right upper limb, including shoulder: Secondary | ICD-10-CM | POA: Diagnosis not present

## 2020-10-18 DIAGNOSIS — L2089 Other atopic dermatitis: Secondary | ICD-10-CM | POA: Diagnosis not present

## 2020-10-18 DIAGNOSIS — D239 Other benign neoplasm of skin, unspecified: Secondary | ICD-10-CM | POA: Diagnosis not present

## 2020-10-18 DIAGNOSIS — D2372 Other benign neoplasm of skin of left lower limb, including hip: Secondary | ICD-10-CM | POA: Diagnosis not present

## 2020-10-25 DIAGNOSIS — Z1211 Encounter for screening for malignant neoplasm of colon: Secondary | ICD-10-CM | POA: Diagnosis not present

## 2020-10-30 LAB — COLOGUARD
COLOGUARD: NEGATIVE
Cologuard: NEGATIVE

## 2020-11-21 DIAGNOSIS — H43812 Vitreous degeneration, left eye: Secondary | ICD-10-CM | POA: Diagnosis not present

## 2020-11-21 DIAGNOSIS — H04123 Dry eye syndrome of bilateral lacrimal glands: Secondary | ICD-10-CM | POA: Diagnosis not present

## 2020-11-21 DIAGNOSIS — M450 Ankylosing spondylitis of multiple sites in spine: Secondary | ICD-10-CM | POA: Diagnosis not present

## 2020-12-14 DIAGNOSIS — M455 Ankylosing spondylitis of thoracolumbar region: Secondary | ICD-10-CM | POA: Diagnosis not present

## 2020-12-14 DIAGNOSIS — H209 Unspecified iridocyclitis: Secondary | ICD-10-CM | POA: Diagnosis not present

## 2020-12-14 DIAGNOSIS — Z79899 Other long term (current) drug therapy: Secondary | ICD-10-CM | POA: Diagnosis not present

## 2021-03-07 ENCOUNTER — Encounter: Payer: Self-pay | Admitting: Family Medicine

## 2021-03-07 ENCOUNTER — Other Ambulatory Visit: Payer: Self-pay

## 2021-03-07 ENCOUNTER — Ambulatory Visit (INDEPENDENT_AMBULATORY_CARE_PROVIDER_SITE_OTHER): Payer: Medicare Other | Admitting: Family Medicine

## 2021-03-07 VITALS — BP 122/51 | HR 54 | Ht 60.0 in | Wt 151.0 lb

## 2021-03-07 DIAGNOSIS — I10 Essential (primary) hypertension: Secondary | ICD-10-CM | POA: Diagnosis not present

## 2021-03-07 DIAGNOSIS — E0590 Thyrotoxicosis, unspecified without thyrotoxic crisis or storm: Secondary | ICD-10-CM | POA: Diagnosis not present

## 2021-03-07 DIAGNOSIS — M459 Ankylosing spondylitis of unspecified sites in spine: Secondary | ICD-10-CM | POA: Diagnosis not present

## 2021-03-07 MED ORDER — HYDROCHLOROTHIAZIDE 25 MG PO TABS: 25.0000 mg | ORAL_TABLET | Freq: Every day | ORAL | 1 refills | Status: DC

## 2021-03-07 NOTE — Assessment & Plan Note (Signed)
Well on her Humira as she feels like it makes a big difference in her joint pain.

## 2021-03-07 NOTE — Assessment & Plan Note (Signed)
Well controlled. Continue current regimen. Follow up in  6 mo  

## 2021-03-07 NOTE — Assessment & Plan Note (Signed)
Due to recheck thyroid level.  

## 2021-03-07 NOTE — Progress Notes (Signed)
Established Patient Office Visit  Subjective:  Patient ID: Joan Yang, female    DOB: [birth year]  Age: 65 y.o. MRN: [id number]  CC:  Chief Complaint  Patient presents with   Hypertension    HPI Joan Yang presents for   Hypertension- Pt denies chest pain, SOB, dizziness, or heart palpitations.  Taking meds as directed w/o problems.  Denies medication side effects.  No swelling.  She has her mammogram scheduled for next month.  She did not get a chance to go for labs previously but says this time she will.  But she has sleep today because her husband is not doing well.  She did want to let me know that she stopped her Prempro.  Past Medical History:  Diagnosis Date   ankylosis spondylitis 01/28/2007   Qualifier: Diagnosis of  By: Cathey Endow DO, Karen     Palpitations 01/29/2006   Qualifier: Diagnosis of  By: Cathey Endow DO, Karen      No past surgical history on file.  Family History  Problem Relation Age of Onset   Rheum arthritis Mother    Congestive Heart Failure Mother    Hypertension Mother    Parkinson's disease Father     Social History   Socioeconomic History   Marital status: Married    Spouse name: Not on file   Number of children: Not on file   Years of education: Not on file   Highest education level: Not on file  Occupational History   Not on file  Tobacco Use   Smoking status: Former   Smokeless tobacco: Never  Substance and Sexual Activity   Alcohol use: Not on file   Drug use: Not on file   Sexual activity: Not on file  Other Topics Concern   Not on file  Social History Narrative   Not on file   Social Determinants of Health   Financial Resource Strain: Not on file  Food Insecurity: Not on file  Transportation Needs: Not on file  Physical Activity: Not on file  Stress: Not on file  Social Connections: Not on file  Intimate Partner Violence: Not on file    Outpatient Medications Prior to Visit  Medication Sig Dispense Refill   HUMIRA  PEN 40 MG/0.8ML PNKT Inject 40 mg into the skin every 14 (fourteen) days.  1   hydrochlorothiazide (HYDRODIURIL) 25 MG tablet Take 1 tablet (25 mg total) by mouth daily. 90 tablet 1   estrogen, conjugated,-medroxyprogesterone (PREMPRO) 0.45-1.5 MG tablet Take 1 tablet by mouth daily. 56 tablet 4   No facility-administered medications prior to visit.    No Known Allergies  ROS Review of Systems    Objective:    Physical Exam Constitutional:      Appearance: Normal appearance. She is well-developed.  HENT:     Head: Normocephalic and atraumatic.  Cardiovascular:     Rate and Rhythm: Normal rate and regular rhythm.     Heart sounds: Normal heart sounds.  Pulmonary:     Effort: Pulmonary effort is normal.     Breath sounds: Normal breath sounds.  Skin:    General: Skin is warm and dry.  Neurological:     Mental Status: She is alert and oriented to person, place, and time.  Psychiatric:        Behavior: Behavior normal.    BP (!) 122/51   Pulse (!) 54   Ht 5' (1.524 m)   Wt 151 lb (68.5 kg)   SpO2 100%  BMI 29.49 kg/m  Wt Readings from Last 3 Encounters:  03/07/21 151 lb (68.5 kg)  08/31/20 152 lb (68.9 kg)  03/03/20 149 lb (67.6 kg)     There are no preventive care reminders to display for this patient.  There are no preventive care reminders to display for this patient.  Lab Results  Component Value Date   TSH 0.22 (L) 09/01/2019   Lab Results  Component Value Date   WBC 6.4 09/01/2019   HGB 14.0 09/01/2019   HCT 42.4 09/01/2019   MCV 84.0 09/01/2019   PLT 245 09/01/2019   Lab Results  Component Value Date   NA 141 04/11/2020   K 3.6 04/11/2020   CO2 29 04/11/2020   GLUCOSE 81 04/11/2020   BUN 17 04/11/2020   CREATININE 1.09 (H) 04/11/2020   BILITOT 0.7 09/01/2019   ALKPHOS 44 08/28/2016   AST 23 09/01/2019   ALT 15 09/01/2019   PROT 7.4 09/01/2019   ALBUMIN 3.9 08/28/2016   CALCIUM 9.2 04/11/2020   Lab Results  Component Value Date    CHOL 236 (H) 09/01/2019   Lab Results  Component Value Date   HDL 87 09/01/2019   Lab Results  Component Value Date   LDLCALC 134 (H) 09/01/2019   Lab Results  Component Value Date   TRIG 48 09/01/2019   Lab Results  Component Value Date   CHOLHDL 2.7 09/01/2019   Lab Results  Component Value Date   HGBA1C 5.1 08/13/2019      Assessment & Plan:   Problem List Items Addressed This Visit       Cardiovascular and Mediastinum   HYPERTENSION, BENIGN SYSTEMIC - Primary    Well controlled. Continue current regimen. Follow up in  6 mo       Relevant Medications   hydrochlorothiazide (HYDRODIURIL) 25 MG tablet   Other Relevant Orders   Lipid Panel w/reflex Direct LDL   COMPLETE METABOLIC PANEL WITH GFR   TSH     Endocrine   Subclinical hyperthyroidism (Chronic)    Due to recheck thyroid level.      Relevant Orders   Lipid Panel w/reflex Direct LDL   COMPLETE METABOLIC PANEL WITH GFR   TSH     Musculoskeletal and Integument   Ankylosing spondylitis (HCC) (Chronic)    Well on her Humira as she feels like it makes a big difference in her joint pain.       Meds ordered this encounter  Medications   hydrochlorothiazide (HYDRODIURIL) 25 MG tablet    Sig: Take 1 tablet (25 mg total) by mouth daily.    Dispense:  90 tablet    Refill:  1     Follow-up: Return in about 6 months (around 09/04/2021) for Hypertension.    Nani Gasser, MD

## 2021-03-27 ENCOUNTER — Telehealth: Payer: Medicare Other | Admitting: Physician Assistant

## 2021-03-27 DIAGNOSIS — J069 Acute upper respiratory infection, unspecified: Secondary | ICD-10-CM

## 2021-03-27 MED ORDER — BENZONATATE 100 MG PO CAPS: 100.0000 mg | ORAL_CAPSULE | Freq: Three times a day (TID) | ORAL | 0 refills | Status: DC | PRN

## 2021-03-27 MED ORDER — IPRATROPIUM BROMIDE 0.03 % NA SOLN: 2.0000 | Freq: Two times a day (BID) | NASAL | 0 refills | Status: DC

## 2021-03-27 NOTE — Progress Notes (Signed)
E-Visit for Upper Respiratory Infection   We are sorry you are not feeling well.  Here is how we plan to help!  Based on what you have shared with me, it looks like you may have a viral upper respiratory infection.  Upper respiratory infections are caused by a large number of viruses; however, rhinovirus is the most common cause.   Symptoms vary from person to person, with common symptoms including sore throat, cough, fatigue or lack of energy and feeling of general discomfort.  A low-grade fever of up to 100.4 may present, but is often uncommon.  Symptoms vary however, and are closely related to a person's age or underlying illnesses.  The most common symptoms associated with an upper respiratory infection are nasal discharge or congestion, cough, sneezing, headache and pressure in the ears and face.  These symptoms usually persist for about 3 to 10 days, but can last up to 2 weeks.  It is important to know that upper respiratory infections do not cause serious illness or complications in most cases.    Upper respiratory infections can be transmitted from person to person, with the most common method of transmission being a person's hands.  The virus is able to live on the skin and can infect other persons for up to 2 hours after direct contact.  Also, these can be transmitted when someone coughs or sneezes; thus, it is important to cover the mouth to reduce this risk.  To keep the spread of the illness at [place], good hand hygiene is very important.  This is an infection that is most likely caused by a virus. There are no specific treatments other than to help you with the symptoms until the infection runs its course.  We are sorry you are not feeling well.  Here is how we plan to help!   For nasal congestion, you may use an oral decongestants such as Mucinex D or if you have glaucoma or high blood pressure use plain Mucinex.  Saline nasal spray or nasal drops can help and can safely be used as often as  needed for congestion.  For your congestion, I have prescribed Ipratropium Bromide nasal spray 0.03% two sprays in each nostril 2-3 times a day  If you do not have a history of heart disease, hypertension, diabetes or thyroid disease, prostate/bladder issues or glaucoma, you may also use Sudafed to treat nasal congestion.  It is highly recommended that you consult with a pharmacist or your primary care physician to ensure this medication is safe for you to take.     If you have a cough, you may use cough suppressants such as Delsym and Robitussin.  If you have glaucoma or high blood pressure, you can also use Coricidin HBP.   For cough I have prescribed for you A prescription cough medication called Tessalon Perles 100 mg. You may take 1-2 capsules every 8 hours as needed for cough  If you have a sore or scratchy throat, use a saltwater gargle-  to  teaspoon of salt dissolved in a 4-ounce to 8-ounce glass of warm water.  Gargle the solution for approximately 15-30 seconds and then spit.  It is important not to swallow the solution.  You can also use throat lozenges/cough drops and Chloraseptic spray to help with throat pain or discomfort.  Warm or cold liquids can also be helpful in relieving throat pain.  For headache, pain or general discomfort, you can use Ibuprofen or Tylenol as directed.  Some authorities believe that zinc sprays or the use of Echinacea may shorten the course of your symptoms.  Corona Virus Screening  Your current symptoms could be consistent with the coronavirus.  Many health care providers can now test patients at their office but not all are.  [facility] has multiple testing sites. For information on our COVID testing locations and hours go to [email or web address]  We are enrolling you in our MyChart Home Monitoring for COVID19 . Daily you will receive a questionnaire within the MyChart website. Our COVID 19 response team will be monitoring your responses  daily.  Testing Information: The COVID-19 Community Testing sites are testing BY APPOINTMENT ONLY.  You can schedule online at [email or web address]  If you do not have access to a smart phone or computer you may call [phone] for an appointment.   Additional testing sites in the Community:  For [facility] Testing sites in [place]  [email or web address]  For Pop[facility] testing sites in [place]  [email or web address]  For [email or web address]  For [facility] testing in [place] and [email or web address]  For [facility] testing in [place]   [email or web address]  For  more information about community testing call [phone]   Please quarantine yourself while awaiting your test results. Please stay home for a minimum of 10 days from the first day of illness with improving symptoms and you have had 24 hours of no fever (without the use of Tylenol (Acetaminophen) Motrin (Ibuprofen) or any fever reducing medication).  Also - Do not get tested prior to returning to work because once you have had a positive test the test can stay positive for more than a month in some cases.   You should wear a mask or cloth face covering over your nose and mouth if you must be around other people or animals, including pets (even at home). Try to stay at least 6 feet away from other people. This will protect the people around you.  Please continue good preventive care measures, including:  frequent hand-washing, avoid touching your face, cover coughs/sneezes, stay out of crowds and keep a 6 foot distance from others.  COVID-19 is a respiratory illness with  symptoms that are similar to the flu. Symptoms are typically mild to moderate, but there have been cases of severe illness and death due to the virus.   The following symptoms may appear 2-14 days after exposure: Fever Cough Shortness of breath or difficulty breathing Chills Repeated shaking with chills Muscle pain Headache Sore throat New loss of taste or smell Fatigue Congestion or runny nose Nausea or vomiting Diarrhea  Go to the nearest hospital ED for assessment if fever/cough/breathlessness are severe or illness seems like a threat to life.  It is vitally important that if you feel that you have an infection such as this virus or any other virus that you stay home and away from places where you may spread it to others.  You should avoid contact with people age 79 and older.   You may also take acetaminophen (Tylenol) as needed for fever.  Reduce your risk of any infection by using the same precautions used for avoiding the common cold or flu:  Wash your hands often with soap and warm water for at least 20 seconds.  If soap and water are not readily available, use an alcohol-based hand sanitizer with at least 60% alcohol.  If coughing or sneezing, cover your mouth and nose by coughing or sneezing into the elbow  areas of your shirt or coat, into a tissue or into your sleeve (not your hands). Avoid shaking hands with others and consider head nods or verbal greetings only. Avoid touching your eyes, nose, or mouth with unwashed hands.  Avoid close contact with people who are sick. Avoid places or events with large numbers of people in one location, like concerts or sporting events. Carefully consider travel plans you have or are making. If you are planning any travel outside or inside the [place], visit the CDC's Travelers' Health webpage for the latest health notices. If you have some symptoms but not all symptoms, continue to monitor at home and seek medical attention if your symptoms  worsen. If you are having a medical emergency, call 911.   HOME CARE Only take medications as instructed by your medical team. Be sure to drink plenty of fluids. Water is fine as well as fruit juices, sodas and electrolyte beverages. You may want to stay away from caffeine or alcohol. If you are nauseated, try taking small sips of liquids. How do you know if you are getting enough fluid? Your urine should be a pale yellow or almost colorless. Get rest. Taking a steamy shower or using a humidifier may help nasal congestion and ease sore throat pain. You can place a towel over your head and breathe in the steam from hot water coming from a faucet. Using a saline nasal spray works much the same way. Cough drops, hard candies and sore throat lozenges may ease your cough. Avoid close contacts especially the very young and the elderly Cover your mouth if you cough or sneeze Always remember to wash your hands.   GET HELP RIGHT AWAY IF: You develop worsening fever. If your symptoms do not improve within 10 days You develop yellow or green discharge from your nose over 3 days. You have coughing fits You develop a severe head ache or visual changes. You develop shortness of breath, difficulty breathing or start having chest pain Your symptoms persist after you have completed your treatment plan  MAKE SURE YOU  Understand these instructions. Will watch your condition. Will get help right away if you are not doing well or get worse.  Thank you for choosing an e-visit.  Your e-visit answers were reviewed by a board certified advanced clinical practitioner to complete your personal care plan. Depending upon the condition, your plan could have included both over the counter or prescription medications.  Please review your pharmacy choice. Make sure the pharmacy is open so you can pick up prescription now. If there is a problem, you may contact your provider through [employer] and have the  prescription routed to another pharmacy.  Your safety is important to [place]. If you have drug allergies check your prescription carefully.   For the next 24 hours you can use MyChart to ask questions about today's visit, request a non-urgent call back, or ask for a work or school excuse. You will get an email in the next two days asking about your experience. I hope that your e-visit has been valuable and will speed your recovery.   I provided 6 minutes of non face-to-face time during this encounter for chart review and documentation.

## 2021-04-19 ENCOUNTER — Telehealth (INDEPENDENT_AMBULATORY_CARE_PROVIDER_SITE_OTHER): Payer: Medicare Other | Admitting: Physician Assistant

## 2021-04-19 ENCOUNTER — Encounter: Payer: Self-pay | Admitting: Physician Assistant

## 2021-04-19 VITALS — BP 119/63 | HR 76 | Ht 60.0 in | Wt 151.0 lb

## 2021-04-19 DIAGNOSIS — J040 Acute laryngitis: Secondary | ICD-10-CM | POA: Diagnosis not present

## 2021-04-19 DIAGNOSIS — H938X3 Other specified disorders of ear, bilateral: Secondary | ICD-10-CM | POA: Diagnosis not present

## 2021-04-19 MED ORDER — METHYLPREDNISOLONE 4 MG PO TBPK: ORAL_TABLET | ORAL | 0 refills | Status: DC

## 2021-04-19 NOTE — Progress Notes (Signed)
Laryngitis - 3 weeks Was getting better but now worse again Sore throat - yesterday AM Ear pressure (feels like they need to pop, like on a plane)  Has taken some Ibuprofen for symptoms

## 2021-04-19 NOTE — Progress Notes (Signed)
..  Virtual Visit via Video Note  I connected with Joan Yang on 04/19/21 at 10:30 AM EST by a video enabled telemedicine application and verified that I am speaking with the correct person using two identifiers.  Location: Patient: home Provider: clinic  .Marland KitchenParticipating in visit:  Patient: Joan Yang Provider: Tandy Gaw PA-C   I discussed the limitations of evaluation and management by telemedicine and the availability of in person appointments. The patient expressed understanding and agreed to proceed.  History of Present Illness: Pt is a 65 yo female on humiria every 14 days who presents to the clinic with 3 weeks of laryngitis. She was seen virtually on 12/5 and given atrovent nasal spray and symptomatic care. Pt was feeling and sounding a little better but then over the weekend started with a ST and losing her voice again. She denies any congestion or cough. No fever, chills body aches. She does have bilateral ear pressure like they want to pop.   .. Active Ambulatory Problems    Diagnosis Date Noted   HYPERTENSION, BENIGN SYSTEMIC 01/29/2006   Ankylosing spondylitis (HCC) 01/28/2007   POSTMENOPAUSAL STATUS 06/07/2006   ATOPIC DERMATITIS 01/29/2006   Disorder of bone and cartilage 12/09/2006   Multinodular goiter (nontoxic) 06/13/2012   Hypokalemia 08/05/2012   Subclinical hyperthyroidism 08/05/2012   Degeneration of posterior vitreous body 01/02/2018   Cortical age-related cataract, right eye 02/22/2017   High risk medication use 09/22/2015   Oral aphthous ulcer 09/01/2019   Resolved Ambulatory Problems    Diagnosis Date Noted   HYPERTHYROIDISM, SUBCLINICAL 01/29/2006   ANEMIA, IRON DEFICIENCY, UNSPEC. 01/29/2006   LEUKOPLAKIA, ORAL MUCOSA 10/02/2007   NAIL PITTING 05/15/2007   JOINT EFFUSION, LEFT KNEE 03/17/2007   SPONDYLOARTHROPATHY 01/29/2006   INSOMNIA 05/15/2007   Palpitations 01/29/2006   Viral pharyngitis 06/13/2012   Chest tightness 06/23/2018   Past  Medical History:  Diagnosis Date   ankylosis spondylitis 01/28/2007       Observations/Objective: No acute distress Normal breathing without cough, wheezing, labor Normal mood and appearance  .Marland Kitchen Today's Vitals   04/19/21 0858  BP: 119/63  Pulse: 76  Weight: 151 lb (68.5 kg)  Height: 5' (1.524 m)   Body mass index is 29.49 kg/m.  Assessment and Plan: Marland KitchenMarland KitchenKiri was seen today for sore throat.  Diagnoses and all orders for this visit:  Laryngitis -     methylPREDNISolone (MEDROL DOSEPAK) 4 MG TBPK tablet; Take as directed by package insert.  Ear pressure, bilateral -     methylPREDNISolone (MEDROL DOSEPAK) 4 MG TBPK tablet; Take as directed by package insert.   Sounds like patient is having some post viral inflammation no signs of bacterial infection Start medrol dose pack Gargle with salt water Rest voice Follow up as needed or if symptoms worsen or change.    Follow Up Instructions:    I discussed the assessment and treatment plan with the patient. The patient was provided an opportunity to ask questions and all were answered. The patient agreed with the plan and demonstrated an understanding of the instructions.   The patient was advised to call back or seek an in-person evaluation if the symptoms worsen or if the condition fails to improve as anticipated.    Tandy Gaw, PA-C

## 2021-05-11 LAB — HM MAMMOGRAPHY

## 2021-07-12 ENCOUNTER — Encounter: Payer: Self-pay | Admitting: Family Medicine

## 2021-09-04 ENCOUNTER — Ambulatory Visit: Payer: BLUE CROSS/BLUE SHIELD | Admitting: Family Medicine

## 2021-09-07 ENCOUNTER — Other Ambulatory Visit: Payer: Self-pay | Admitting: Family Medicine

## 2021-09-07 DIAGNOSIS — I10 Essential (primary) hypertension: Secondary | ICD-10-CM

## 2021-10-02 ENCOUNTER — Ambulatory Visit: Payer: BLUE CROSS/BLUE SHIELD | Admitting: Family Medicine

## 2021-11-30 ENCOUNTER — Ambulatory Visit (INDEPENDENT_AMBULATORY_CARE_PROVIDER_SITE_OTHER): Payer: Medicare Other | Admitting: Family Medicine

## 2021-11-30 VITALS — BP 136/73 | HR 58 | Ht 60.0 in | Wt 153.0 lb

## 2021-11-30 DIAGNOSIS — M450 Ankylosing spondylitis of multiple sites in spine: Secondary | ICD-10-CM

## 2021-11-30 DIAGNOSIS — Z13220 Encounter for screening for lipoid disorders: Secondary | ICD-10-CM

## 2021-11-30 DIAGNOSIS — I10 Essential (primary) hypertension: Secondary | ICD-10-CM

## 2021-11-30 DIAGNOSIS — Z79899 Other long term (current) drug therapy: Secondary | ICD-10-CM | POA: Diagnosis not present

## 2021-11-30 DIAGNOSIS — E0590 Thyrotoxicosis, unspecified without thyrotoxic crisis or storm: Secondary | ICD-10-CM

## 2021-11-30 MED ORDER — HYDROCHLOROTHIAZIDE 25 MG PO TABS: 25.0000 mg | ORAL_TABLET | Freq: Every day | ORAL | 3 refills | Status: DC

## 2021-11-30 NOTE — Progress Notes (Signed)
   Established Patient Office Visit  Subjective   Patient ID: Joan Yang, female    DOB: [birth year]  Age: 66 y.o. MRN: [id number]  Chief Complaint  Patient presents with   Hypertension    HPI  Hypertension- Pt denies chest pain, SOB, dizziness, or heart palpitations.  Taking meds as directed w/o problems.  Denies medication side effects.  Conditionally checks blood pressure at home and says she gets great numbers at home.  F/U subclinical hypthyroid  -has not had thyroid checked in a couple of years.  But she is asymptomatic no recent changes in skin hair or fatigue or bowel movements.  Mammogram is scheduled for next year.  Doing really well with her Humira for her ankylosing spondylitis.  Has been stable.    ROS    Objective:     BP 136/73   Pulse (!) 58   Ht 5' (1.524 m)   Wt 153 lb (69.4 kg)   SpO2 100%   BMI 29.88 kg/m    Physical Exam Vitals and nursing note reviewed.  Constitutional:      Appearance: She is well-developed.  HENT:     Head: Normocephalic and atraumatic.  Neck:     Comments: NO TM Cardiovascular:     Rate and Rhythm: Normal rate and regular rhythm.     Heart sounds: Normal heart sounds.  Pulmonary:     Effort: Pulmonary effort is normal.     Breath sounds: Normal breath sounds.  Musculoskeletal:     Cervical back: Neck supple. No rigidity or tenderness.  Lymphadenopathy:     Cervical: No cervical adenopathy.  Skin:    General: Skin is warm and dry.  Neurological:     Mental Status: She is alert and oriented to person, place, and time.  Psychiatric:        Behavior: Behavior normal.      No results found for any visits on 11/30/21.    The 10-year ASCVD risk score (Arnett DK, et al., 2019) is: 11%    Assessment & Plan:   Problem List Items Addressed This Visit       Cardiovascular and Mediastinum   HYPERTENSION, BENIGN SYSTEMIC - Primary    Ischial blood pressure little elevated we will recheck before she goes.  Will  get updated lab work to make sure kidney function and potassium are normal.      Relevant Medications   hydrochlorothiazide (HYDRODIURIL) 25 MG tablet   Other Relevant Orders   COMPLETE METABOLIC PANEL WITH GFR     Endocrine   Subclinical hyperthyroidism (Chronic)    Due to recheck TSH.      Relevant Orders   TSH     Musculoskeletal and Integument   Ankylosing spondylitis (HCC) (Chronic)    Stable.      Other Visit Diagnoses     Lipid screening       Relevant Orders   Lipid Panel w/reflex Direct LDL   Medication management       Relevant Orders   TSH   Lipid Panel w/reflex Direct LDL   COMPLETE METABOLIC PANEL WITH GFR   CBC with Differential/Platelet      Encouraged her to consider getting the Prevnar 20.  She will think about it.  Return in about 6 months (around 06/02/2022) for Hypertension.    Nani Gasser, MD

## 2021-11-30 NOTE — Assessment & Plan Note (Signed)
Stable

## 2021-11-30 NOTE — Assessment & Plan Note (Signed)
Ischial blood pressure little elevated we will recheck before she goes.  Will get updated lab work to make sure kidney function and potassium are normal.

## 2021-11-30 NOTE — Assessment & Plan Note (Signed)
Due to recheck TSH. 

## 2021-12-01 LAB — TSH: TSH: 0.36 mIU/L — ABNORMAL LOW (ref 0.40–4.50)

## 2021-12-01 LAB — CBC WITH DIFFERENTIAL/PLATELET
Absolute Monocytes: 543 cells/uL (ref 200–950)
Basophils Absolute: 43 cells/uL (ref 0–200)
Basophils Relative: 0.7 %
Eosinophils Absolute: 73 cells/uL (ref 15–500)
Eosinophils Relative: 1.2 %
HCT: 44.7 % (ref 35.0–45.0)
Hemoglobin: 14.8 g/dL (ref 11.7–15.5)
Lymphs Abs: 2263 cells/uL (ref 850–3900)
MCH: 27.4 pg (ref 27.0–33.0)
MCHC: 33.1 g/dL (ref 32.0–36.0)
MCV: 82.8 fL (ref 80.0–100.0)
MPV: 11.4 fL (ref 7.5–12.5)
Monocytes Relative: 8.9 %
Neutro Abs: 3178 cells/uL (ref 1500–7800)
Neutrophils Relative %: 52.1 %
Platelets: 252 10*3/uL (ref 140–400)
RBC: 5.4 10*6/uL — ABNORMAL HIGH (ref 3.80–5.10)
RDW: 12.9 % (ref 11.0–15.0)
Total Lymphocyte: 37.1 %
WBC: 6.1 10*3/uL (ref 3.8–10.8)

## 2021-12-01 LAB — COMPLETE METABOLIC PANEL WITH GFR
AG Ratio: 1.1 (calc) (ref 1.0–2.5)
ALT: 22 U/L (ref 6–29)
AST: 28 U/L (ref 10–35)
Albumin: 4.1 g/dL (ref 3.6–5.1)
Alkaline phosphatase (APISO): 81 U/L (ref 37–153)
BUN: 17 mg/dL (ref 7–25)
CO2: 28 mmol/L (ref 20–32)
Calcium: 9.8 mg/dL (ref 8.6–10.4)
Chloride: 101 mmol/L (ref 98–110)
Creat: 0.97 mg/dL (ref 0.50–1.05)
Globulin: 3.7 g/dL (calc) (ref 1.9–3.7)
Glucose, Bld: 80 mg/dL (ref 65–99)
Potassium: 3.7 mmol/L (ref 3.5–5.3)
Sodium: 140 mmol/L (ref 135–146)
Total Bilirubin: 0.9 mg/dL (ref 0.2–1.2)
Total Protein: 7.8 g/dL (ref 6.1–8.1)
eGFR: 65 mL/min/{1.73_m2} (ref >=60)

## 2021-12-01 LAB — LIPID PANEL W/REFLEX DIRECT LDL
Cholesterol: 258 mg/dL — ABNORMAL HIGH (ref <200)
HDL: 95 mg/dL (ref >=50)
LDL Cholesterol (Calc): 146 mg/dL (calc) — ABNORMAL HIGH
Non-HDL Cholesterol (Calc): 163 mg/dL (calc) — ABNORMAL HIGH (ref <130)
Total CHOL/HDL Ratio: 2.7 (calc) (ref <5.0)
Triglycerides: 73 mg/dL (ref <150)

## 2021-12-01 NOTE — Progress Notes (Signed)
Hi Joan Yang, your thyroid level is still in the subclinical hyperthyroid.  But it actually looks like it is trending over the years closer to normal which is good.  Plan to recheck again in 1 year.  Cholesterol is high.  In fact and calculating out your 10-year risk for cardiovascular disease and stroke it is greater than 10%.  Because your risk is so high it is highly recommended that you start a statin to lower your risk for stroke and heart attack can make a big difference in reducing that risk over the next 10 years.  If you are okay with starting a statin please let me know and I will send it over it is typically taken at bedtime.  We would then need to recheck your blood work in about 2 months on the medication.  Then, I would highly recommend starting the medication based on your personal risk factors.  Kidney function stable.  Blood count normal.  The 10-year ASCVD risk score (Arnett DK, et al., 2019) is: 11.7%   Values used to calculate the score:     Age: 66 years     Sex: Female     Is Non-Hispanic African American: Yes     Diabetic: No     Tobacco smoker: No     Systolic Blood Pressure: 136 mmHg     Is BP treated: Yes     HDL Cholesterol: 95 mg/dL     Total Cholesterol: 258 mg/dL

## 2021-12-13 ENCOUNTER — Encounter: Payer: Self-pay | Admitting: General Practice

## 2022-06-07 ENCOUNTER — Encounter: Payer: Self-pay | Admitting: Family Medicine

## 2022-06-07 ENCOUNTER — Ambulatory Visit (INDEPENDENT_AMBULATORY_CARE_PROVIDER_SITE_OTHER): Payer: Medicare Other | Admitting: Family Medicine

## 2022-06-07 VITALS — BP 124/59 | HR 62 | Ht 60.0 in | Wt 149.0 lb

## 2022-06-07 DIAGNOSIS — I10 Essential (primary) hypertension: Secondary | ICD-10-CM | POA: Diagnosis not present

## 2022-06-07 DIAGNOSIS — E782 Mixed hyperlipidemia: Secondary | ICD-10-CM

## 2022-06-07 DIAGNOSIS — E785 Hyperlipidemia, unspecified: Secondary | ICD-10-CM | POA: Insufficient documentation

## 2022-06-07 DIAGNOSIS — M450 Ankylosing spondylitis of multiple sites in spine: Secondary | ICD-10-CM | POA: Diagnosis not present

## 2022-06-07 MED ORDER — ATORVASTATIN CALCIUM 20 MG PO TABS: 20.0000 mg | ORAL_TABLET | Freq: Every day | ORAL | 3 refills | Status: DC

## 2022-06-07 NOTE — Progress Notes (Signed)
Established Patient Office Visit  Subjective   Patient ID: Joan Yang, female    DOB: January 17, 1956  Age: 67 y.o. MRN: [id number]  Chief Complaint  Patient presents with   Hypertension    HPI Hypertension- Pt denies chest pain, SOB, dizziness, or heart palpitations.  Taking meds as directed w/o problems.  Denies medication side effects.    Follow-up hyperlipidemia-when I last saw her in August her LDL was quite elevated in the range that would be recommended for starting a statin.  Her 10-year cardiovascular risk score is 13.3%.  The 10-year ASCVD risk score (Arnett DK, et al., 2019) is: 10.3%   Values used to calculate the score:     Age: 2 years     Sex: Female     Is Non-Hispanic African American: Yes     Diabetic: No     Tobacco smoker: No     Systolic Blood Pressure: [id number] mmHg     Is BP treated: Yes     HDL Cholesterol: 95 mg/dL     Total Cholesterol: 258 mg/dL     ROS    Objective:     BP (!) 124/59 (BP Location: Left Arm, Patient Position: Sitting, Cuff Size: Small)   Pulse 62   Ht 5' (1.524 m)   Wt 149 lb (67.6 kg)   SpO2 99%   BMI 29.10 kg/m    Physical Exam Vitals and nursing note reviewed.  Constitutional:      Appearance: She is well-developed.  HENT:     Head: Normocephalic and atraumatic.  Cardiovascular:     Rate and Rhythm: Normal rate and regular rhythm.     Heart sounds: Normal heart sounds.  Pulmonary:     Effort: Pulmonary effort is normal.     Breath sounds: Normal breath sounds.  Skin:    General: Skin is warm and dry.  Neurological:     Mental Status: She is alert and oriented to person, place, and time.  Psychiatric:        Behavior: Behavior normal.     No results found for any visits on 06/07/22.    The 10-year ASCVD risk score (Arnett DK, et al., 2019) is: 10.3%    Assessment & Plan:   Problem List Items Addressed This Visit       Cardiovascular and Mediastinum   HYPERTENSION, BENIGN SYSTEMIC - Primary    Well  controlled. Continue current regimen. Follow up in  3mo      Relevant Medications   atorvastatin (LIPITOR) 20 MG tablet   Other Relevant Orders   COMPLETE METABOLIC PANEL WITH GFR   Sedimentation rate   CBC with Differential/Platelet     Musculoskeletal and Integument   Ankylosing spondylitis (HCC) (Chronic)    She needs some additional labs ordered today for her rheumatologist and since were going to do a blood draw we did add those on including a sed rate and a CBC with differential.  Feels pretty stable on her current regimen.      Relevant Orders   COMPLETE METABOLIC PANEL WITH GFR   Sedimentation rate   CBC with Differential/Platelet     Other   Hyperlipidemia    We reviewed her last cholesterol encouraged to consider a statin she is willing to try.  Discussed potential side effects.  If she is doing well then plan to recheck lipids in 6 months.      Relevant Medications   atorvastatin (LIPITOR) 20 MG tablet  Return in about 6 months (around 12/06/2022) for Hypertension.    Beatrice Lecher, MD

## 2022-06-07 NOTE — Assessment & Plan Note (Signed)
Well controlled. Continue current regimen. Follow up in  6 mo  

## 2022-06-07 NOTE — Assessment & Plan Note (Signed)
She needs some additional labs ordered today for her rheumatologist and since were going to do a blood draw we did add those on including a sed rate and a CBC with differential.  Feels pretty stable on her current regimen.

## 2022-06-07 NOTE — Assessment & Plan Note (Signed)
We reviewed her last cholesterol encouraged to consider a statin she is willing to try.  Discussed potential side effects.  If she is doing well then plan to recheck lipids in 6 months.

## 2022-06-08 LAB — CBC WITH DIFFERENTIAL/PLATELET
Absolute Monocytes: 720 cells/uL (ref 200–950)
Basophils Absolute: 42 cells/uL (ref 0–200)
Basophils Relative: 0.7 %
Eosinophils Absolute: 72 cells/uL (ref 15–500)
Eosinophils Relative: 1.2 %
HCT: 38.7 % (ref 35.0–45.0)
Hemoglobin: 12.9 g/dL (ref 11.7–15.5)
Lymphs Abs: 2316 cells/uL (ref 850–3900)
MCH: 27.3 pg (ref 27.0–33.0)
MCHC: 33.3 g/dL (ref 32.0–36.0)
MCV: 82 fL (ref 80.0–100.0)
MPV: 10.3 fL (ref 7.5–12.5)
Monocytes Relative: 12 %
Neutro Abs: 2850 cells/uL (ref 1500–7800)
Neutrophils Relative %: 47.5 %
Platelets: 256 10*3/uL (ref 140–400)
RBC: 4.72 10*6/uL (ref 3.80–5.10)
RDW: 12.9 % (ref 11.0–15.0)
Total Lymphocyte: 38.6 %
WBC: 6 10*3/uL (ref 3.8–10.8)

## 2022-06-08 LAB — COMPLETE METABOLIC PANEL WITH GFR
AG Ratio: 1.1 (calc) (ref 1.0–2.5)
ALT: 17 U/L (ref 6–29)
AST: 29 U/L (ref 10–35)
Albumin: 3.9 g/dL (ref 3.6–5.1)
Alkaline phosphatase (APISO): 67 U/L (ref 37–153)
BUN: 12 mg/dL (ref 7–25)
CO2: 28 mmol/L (ref 20–32)
Calcium: 9.5 mg/dL (ref 8.6–10.4)
Chloride: 104 mmol/L (ref 98–110)
Creat: 0.89 mg/dL (ref 0.50–1.05)
Globulin: 3.6 g/dL (calc) (ref 1.9–3.7)
Glucose, Bld: 73 mg/dL (ref 65–99)
Potassium: 3.6 mmol/L (ref 3.5–5.3)
Sodium: 140 mmol/L (ref 135–146)
Total Bilirubin: 0.8 mg/dL (ref 0.2–1.2)
Total Protein: 7.5 g/dL (ref 6.1–8.1)
eGFR: 71 mL/min/{1.73_m2} (ref >=60)

## 2022-06-08 LAB — SEDIMENTATION RATE: Sed Rate: 22 mm/h (ref 0–30)

## 2022-06-11 NOTE — Progress Notes (Signed)
Your lab work is within acceptable range and there are no concerning findings.   ?

## 2022-07-09 ENCOUNTER — Telehealth: Payer: Self-pay | Admitting: Family Medicine

## 2022-07-09 NOTE — Telephone Encounter (Signed)
Called patient to schedule Medicare Annual Wellness Visit (AWV). Left message for patient to call back and schedule Medicare Annual Wellness Visit (AWV).  Last date of AWV: Never  Please schedule an appointment at any time with Nurse Health Advisor.  If any questions, please contact me at [phone].  Thank you ,  Morgan Jessup Patient Access Advocate II Direct Dial: [phone]   

## 2022-11-24 ENCOUNTER — Other Ambulatory Visit: Payer: Self-pay | Admitting: Family Medicine

## 2022-11-24 DIAGNOSIS — I10 Essential (primary) hypertension: Secondary | ICD-10-CM

## 2022-12-06 ENCOUNTER — Encounter: Payer: Self-pay | Admitting: Family Medicine

## 2022-12-06 ENCOUNTER — Ambulatory Visit (INDEPENDENT_AMBULATORY_CARE_PROVIDER_SITE_OTHER): Payer: Medicare Other | Admitting: Family Medicine

## 2022-12-06 VITALS — BP 125/52 | HR 57 | Ht 60.0 in | Wt 152.0 lb

## 2022-12-06 DIAGNOSIS — E0590 Thyrotoxicosis, unspecified without thyrotoxic crisis or storm: Secondary | ICD-10-CM | POA: Diagnosis not present

## 2022-12-06 DIAGNOSIS — R7309 Other abnormal glucose: Secondary | ICD-10-CM

## 2022-12-06 DIAGNOSIS — E782 Mixed hyperlipidemia: Secondary | ICD-10-CM

## 2022-12-06 DIAGNOSIS — M450 Ankylosing spondylitis of multiple sites in spine: Secondary | ICD-10-CM

## 2022-12-06 DIAGNOSIS — N951 Menopausal and female climacteric states: Secondary | ICD-10-CM

## 2022-12-06 DIAGNOSIS — I10 Essential (primary) hypertension: Secondary | ICD-10-CM | POA: Diagnosis not present

## 2022-12-06 DIAGNOSIS — Z780 Asymptomatic menopausal state: Secondary | ICD-10-CM

## 2022-12-06 MED ORDER — ATORVASTATIN CALCIUM 20 MG PO TABS
20.0000 mg | ORAL_TABLET | Freq: Every day | ORAL | 1 refills | Status: DC
Start: 2022-12-06 — End: 2023-07-11

## 2022-12-06 NOTE — Progress Notes (Signed)
Established Patient Office Visit  Subjective   Patient ID: Joan Yang, female    DOB: 07-Jun-1955  Age: 67 y.o. MRN: [id number]  Chief Complaint  Patient presents with   Hypertension    HPI Hypertension- Pt denies chest pain, SOB, dizziness, or heart palpitations.  Taking meds as directed w/o problems.  Denies medication side effects.    She never took the statin because her husband took one and felt weak so never started the medication.  She was concerned and wanted to discuss today.    The 10-year ASCVD risk score (Arnett DK, et al., 2019) is: 10.5%   Values used to calculate the score:     Age: 47 years     Sex: Female     Is Non-Hispanic African American: Yes     Diabetic: No     Tobacco smoker: No     Systolic Blood Pressure: 125 mmHg     Is BP treated: Yes     HDL Cholesterol: 95 mg/dL     Total Cholesterol: 258 mg/dL     ROS    Objective:     BP (!) 125/52   Pulse (!) 57   Ht 5' (1.524 m)   Wt 152 lb (68.9 kg)   SpO2 100%   BMI 29.69 kg/m    Physical Exam Vitals and nursing note reviewed.  Constitutional:      Appearance: She is well-developed.  HENT:     Head: Normocephalic and atraumatic.  Cardiovascular:     Rate and Rhythm: Normal rate and regular rhythm.     Heart sounds: Normal heart sounds.  Pulmonary:     Effort: Pulmonary effort is normal.     Breath sounds: Normal breath sounds.  Skin:    General: Skin is warm and dry.  Neurological:     Mental Status: She is alert and oriented to person, place, and time.  Psychiatric:        Behavior: Behavior normal.      No results found for any visits on 12/06/22.    The 10-year ASCVD risk score (Arnett DK, et al., 2019) is: 10.5%    Assessment & Plan:   Problem List Items Addressed This Visit       Cardiovascular and Mediastinum   HYPERTENSION, BENIGN SYSTEMIC - Primary    Well controlled. Continue current regimen. Follow up in  21mo       Relevant Medications   atorvastatin  (LIPITOR) 20 MG tablet   Other Relevant Orders   Lipid panel   CMP14+EGFR   TSH   Hemoglobin A1c     Endocrine   Subclinical hyperthyroidism (Chronic)    To recheck TSH she has been in the subclinical category and plan was to recheck again in a year.      Relevant Orders   Lipid panel   CMP14+EGFR   TSH   Hemoglobin A1c     Musculoskeletal and Integument   Ankylosing spondylitis (HCC) (Chronic)    She follows with rheumatology currently on Humira.      Relevant Orders   Lipid panel   CMP14+EGFR   TSH   CBC with Differential/Platelet   Hemoglobin A1c     Other   Hyperlipidemia    She is willing to try the statin after she gets back form her cruise.  Then plan to recheck labs in one month.  Labs slip provided today.       Relevant Medications   atorvastatin (LIPITOR)  20 MG tablet   Other Relevant Orders   Lipid panel   CMP14+EGFR   TSH   Hemoglobin A1c   Other Visit Diagnoses     Post-menopausal       Relevant Orders   DG Bone Density   Progesterone   Estradiol   Follicle stimulating hormone   Luteinizing hormone   CBC with Differential/Platelet   Hemoglobin A1c   Hot flash, menopausal       Relevant Medications   atorvastatin (LIPITOR) 20 MG tablet   Other Relevant Orders   TSH   Progesterone   Estradiol   Follicle stimulating hormone   Luteinizing hormone   CBC with Differential/Platelet   Hemoglobin A1c   Abnormal glucose       Relevant Orders   Hemoglobin A1c      Hot flashes-they were much better until recently. Will check hormone levels.  Check thyroid.   Return in about 6 months (around 06/08/2023) for Hypertension.    Nani Gasser, MD

## 2022-12-06 NOTE — Assessment & Plan Note (Signed)
Well controlled. Continue current regimen. Follow up in  6 mo  

## 2022-12-06 NOTE — Assessment & Plan Note (Signed)
She follows with rheumatology currently on Humira.

## 2022-12-06 NOTE — Assessment & Plan Note (Signed)
To recheck TSH she has been in the subclinical category and plan was to recheck again in a year.

## 2022-12-06 NOTE — Assessment & Plan Note (Signed)
She is willing to try the statin after she gets back form her cruise.  Then plan to recheck labs in one month.  Labs slip provided today.

## 2022-12-13 ENCOUNTER — Other Ambulatory Visit: Payer: Self-pay | Admitting: Radiology

## 2023-01-02 ENCOUNTER — Other Ambulatory Visit: Payer: BLUE CROSS/BLUE SHIELD

## 2023-05-20 LAB — HM MAMMOGRAPHY

## 2023-05-23 ENCOUNTER — Encounter: Payer: Self-pay | Admitting: Family Medicine

## 2023-06-12 ENCOUNTER — Encounter: Payer: Self-pay | Admitting: Family Medicine

## 2023-06-12 LAB — ESTRADIOL: Estradiol: 7.8 pg/mL (ref 0.0–54.7)

## 2023-06-12 LAB — CMP14+EGFR
ALT: 16 [IU]/L (ref 0–32)
AST: 27 [IU]/L (ref 0–40)
Albumin: 4.2 g/dL (ref 3.9–4.9)
Alkaline Phosphatase: 76 [IU]/L (ref 44–121)
BUN/Creatinine Ratio: 16 (ref 12–28)
BUN: 14 mg/dL (ref 8–27)
Bilirubin Total: 1 mg/dL (ref 0.0–1.2)
CO2: 26 mmol/L (ref 20–29)
Calcium: 9.5 mg/dL (ref 8.7–10.3)
Chloride: 102 mmol/L (ref 96–106)
Creatinine, Ser: 0.89 mg/dL (ref 0.57–1.00)
Globulin, Total: 3.4 g/dL (ref 1.5–4.5)
Glucose: 85 mg/dL (ref 70–99)
Potassium: 3.6 mmol/L (ref 3.5–5.2)
Sodium: 141 mmol/L (ref 134–144)
Total Protein: 7.6 g/dL (ref 6.0–8.5)
eGFR: 71 mL/min/{1.73_m2} (ref >59)

## 2023-06-12 LAB — CBC WITH DIFFERENTIAL/PLATELET
Basophils Absolute: 0 10*3/uL (ref 0.0–0.2)
Basos: 1 %
EOS (ABSOLUTE): 0.1 10*3/uL (ref 0.0–0.4)
Eos: 1 %
Hematocrit: 43.4 % (ref 34.0–46.6)
Hemoglobin: 13.9 g/dL (ref 11.1–15.9)
Immature Grans (Abs): 0 10*3/uL (ref 0.0–0.1)
Immature Granulocytes: 0 %
Lymphocytes Absolute: 2.8 10*3/uL (ref 0.7–3.1)
Lymphs: 46 %
MCH: 26.6 pg (ref 26.6–33.0)
MCHC: 32 g/dL (ref 31.5–35.7)
MCV: 83 fL (ref 79–97)
Monocytes Absolute: 0.7 10*3/uL (ref 0.1–0.9)
Monocytes: 12 %
Neutrophils Absolute: 2.3 10*3/uL (ref 1.4–7.0)
Neutrophils: 40 %
Platelets: 239 10*3/uL (ref 150–450)
RBC: 5.23 x10E6/uL (ref 3.77–5.28)
RDW: 12.5 % (ref 11.7–15.4)
WBC: 5.9 10*3/uL (ref 3.4–10.8)

## 2023-06-12 LAB — LIPID PANEL
Chol/HDL Ratio: 2 {ratio} (ref 0.0–4.4)
Cholesterol, Total: 174 mg/dL (ref 100–199)
HDL: 86 mg/dL (ref >39)
LDL Chol Calc (NIH): 78 mg/dL (ref 0–99)
Triglycerides: 46 mg/dL (ref 0–149)
VLDL Cholesterol Cal: 10 mg/dL (ref 5–40)

## 2023-06-12 LAB — PROGESTERONE: Progesterone: 0.3 ng/mL

## 2023-06-12 LAB — HEMOGLOBIN A1C
Est. average glucose Bld gHb Est-mCnc: 108 mg/dL
Hgb A1c MFr Bld: 5.4 % (ref 4.8–5.6)

## 2023-06-12 LAB — TSH: TSH: 0.207 u[IU]/mL — ABNORMAL LOW (ref 0.450–4.500)

## 2023-06-12 LAB — LUTEINIZING HORMONE: LH: 71 m[IU]/mL — ABNORMAL HIGH (ref 7.7–58.5)

## 2023-06-12 LAB — FOLLICLE STIMULATING HORMONE: FSH: 156 m[IU]/mL — ABNORMAL HIGH (ref 25.8–134.8)

## 2023-06-12 NOTE — Progress Notes (Signed)
 Hi Joan Yang, thyroid level is stable usually you are around 0.2-0.3 so it stable over the last year.  Your FSH and LH hormones are consistent with being postmenopausal.  Progesterone is low which again is typical for postmenopausal so nothing unusual.  Estrogen level is where we would expect it to be as well.  Cholesterol and metabolic panel look good.  A1c looks great.  No sign of diabetes or prediabetes.

## 2023-06-13 ENCOUNTER — Ambulatory Visit: Payer: BLUE CROSS/BLUE SHIELD | Admitting: Family Medicine

## 2023-07-11 ENCOUNTER — Encounter: Payer: Self-pay | Admitting: Family Medicine

## 2023-07-11 ENCOUNTER — Ambulatory Visit (INDEPENDENT_AMBULATORY_CARE_PROVIDER_SITE_OTHER): Payer: BLUE CROSS/BLUE SHIELD | Admitting: Family Medicine

## 2023-07-11 VITALS — BP 125/58 | HR 52 | Ht 60.0 in | Wt 154.0 lb

## 2023-07-11 DIAGNOSIS — N951 Menopausal and female climacteric states: Secondary | ICD-10-CM

## 2023-07-11 DIAGNOSIS — M450 Ankylosing spondylitis of multiple sites in spine: Secondary | ICD-10-CM

## 2023-07-11 DIAGNOSIS — I10 Essential (primary) hypertension: Secondary | ICD-10-CM | POA: Diagnosis not present

## 2023-07-11 DIAGNOSIS — Z780 Asymptomatic menopausal state: Secondary | ICD-10-CM

## 2023-07-11 DIAGNOSIS — E782 Mixed hyperlipidemia: Secondary | ICD-10-CM | POA: Diagnosis not present

## 2023-07-11 MED ORDER — ATORVASTATIN CALCIUM 20 MG PO TABS: 20.0000 mg | ORAL_TABLET | ORAL | 3 refills | Status: AC

## 2023-07-11 NOTE — Assessment & Plan Note (Signed)
 Currently on Humira.  She has a lot of morning stiffness but says it does not keep her from doing the things that she likes to do.

## 2023-07-11 NOTE — Progress Notes (Signed)
   Established Patient Office Visit  Subjective  Patient ID: Joan Yang, female    DOB: December 22, 1955  Age: 68 y.o. MRN: [id number]  Chief Complaint  Patient presents with   Hypertension    HPI  She had labs done last month indu CBC, CMP, A1C and hormones.     ROS    Objective:     BP (!) 125/58   Pulse (!) 52   Ht 5' (1.524 m)   Wt 154 lb (69.9 kg)   SpO2 100%   BMI 30.08 kg/m    Physical Exam Vitals and nursing note reviewed.  Constitutional:      Appearance: Normal appearance.  HENT:     Head: Normocephalic and atraumatic.  Eyes:     Conjunctiva/sclera: Conjunctivae normal.  Cardiovascular:     Rate and Rhythm: Normal rate and regular rhythm.  Pulmonary:     Effort: Pulmonary effort is normal.     Breath sounds: Normal breath sounds.  Skin:    General: Skin is warm and dry.  Neurological:     Mental Status: She is alert.  Psychiatric:        Mood and Affect: Mood normal.      No results found for any visits on 07/11/23.    The 10-year ASCVD risk score (Arnett DK, et al., 2019) is: 8.2%    Assessment & Plan:   Problem List Items Addressed This Visit       Cardiovascular and Mediastinum   HYPERTENSION, BENIGN SYSTEMIC - Primary   Well controlled. Continue current regimen. Follow up in  39mo       Relevant Medications   atorvastatin (LIPITOR) 20 MG tablet (Start on 07/12/2023)     Musculoskeletal and Integument   Ankylosing spondylitis (HCC) (Chronic)   Currently on Humira.  She has a lot of morning stiffness but says it does not keep her from doing the things that she likes to do.        Other   POSTMENOPAUSAL STATUS   Will order DEXA. Needs to be done at [facility] bc of insurance.       Hyperlipidemia   Has been able to take her statin 3 days pwer week  on M, W, and F      Relevant Medications   atorvastatin (LIPITOR) 20 MG tablet (Start on 07/12/2023)   Other Visit Diagnoses       Post-menopausal       Relevant Orders   DG  Bone Density       Return in about 6 months (around 01/11/2024) for Hypertension.    Nani Gasser, MD

## 2023-07-11 NOTE — Assessment & Plan Note (Signed)
 Has been able to take her statin 3 days pwer week  on M, W, and F

## 2023-07-11 NOTE — Assessment & Plan Note (Signed)
 Will order DEXA. Needs to be done at [facility] bc of insurance.

## 2023-07-11 NOTE — Assessment & Plan Note (Addendum)
 Doing Well, well controlled. No recent sxs.  Continue current regimen. Follow up in  79mo

## 2023-08-31 ENCOUNTER — Other Ambulatory Visit: Payer: Self-pay | Admitting: Family Medicine

## 2023-08-31 DIAGNOSIS — E782 Mixed hyperlipidemia: Secondary | ICD-10-CM

## 2023-11-14 ENCOUNTER — Ambulatory Visit (INDEPENDENT_AMBULATORY_CARE_PROVIDER_SITE_OTHER)

## 2023-11-14 VITALS — Ht 64.0 in | Wt 150.0 lb

## 2023-11-14 DIAGNOSIS — Z1211 Encounter for screening for malignant neoplasm of colon: Secondary | ICD-10-CM

## 2023-11-14 DIAGNOSIS — Z0000 Encounter for general adult medical examination without abnormal findings: Secondary | ICD-10-CM

## 2023-11-14 NOTE — Patient Instructions (Signed)
  Joan Yang , Thank you for taking time to come for your Medicare Wellness Visit. I appreciate your ongoing commitment to your health goals. Please review the following plan we discussed and let me know if I can assist you in the future.   These are the goals we discussed:  Goals      Patient Stated     Patient states she would like to exercise at least 2 times a week.         This is a list of the screening recommended for you and due dates:  Health Maintenance  Topic Date Due   COVID-19 Vaccine (1) Never done   Zoster (Shingles) Vaccine (1 of 2) Never done   Cologuard (Stool DNA test)  10/31/2023   Pneumococcal Vaccine for age over 51 (1 of 1 - PCV) 12/06/2023*   Flu Shot  11/22/2023   Medicare Annual Wellness Visit  11/13/2024   Mammogram  05/19/2025   DTaP/Tdap/Td vaccine (5 - Td or Tdap) 03/01/2029   DEXA scan (bone density measurement)  Completed   Hepatitis C Screening  Completed   Hepatitis B Vaccine  Aged Out   HPV Vaccine  Aged Out   Meningitis B Vaccine  Aged Out  *Topic was postponed. The date shown is not the original due date.

## 2023-11-14 NOTE — Progress Notes (Signed)
 Subjective:   Joan Yang is a 68 y.o. female who presents for Medicare Annual (Subsequent) preventive examination.  Visit Complete: Virtual I connected with  Joan Yang on 11/14/23 by a audio enabled telemedicine application and verified that I am speaking with the correct person using two identifiers.  Patient Location: Home  Provider Location: Office/Clinic  I discussed the limitations of evaluation and management by telemedicine. The patient expressed understanding and agreed to proceed.  Vital Signs: Because this visit was a virtual/telehealth visit, some criteria may be missing or patient reported. Any vitals not documented were not able to be obtained and vitals that have been documented are patient reported.  Patient Medicare AWV questionnaire was completed by the patient on n/a; I have confirmed that all information answered by patient is correct and no changes since this date.  Cardiac Risk Factors include: advanced age (>49men, >18 women);dyslipidemia;hypertension;smoking/ tobacco exposure     Objective:    Today's Vitals   11/14/23 1352 11/14/23 1353  Weight: 150 lb (68 kg)   Height: 5' 4 (1.626 m)   PainSc:  1    Body mass index is 25.75 kg/m.     11/14/2023    2:03 PM 03/02/2019   10:40 AM  Advanced Directives  Does Patient Have a Medical Advance Directive? Yes Yes  Type of Estate agent of [place];Living will Healthcare Power of [place];Living will  Does patient want to make changes to medical advance directive? No - Patient declined No - Patient declined  Copy of Healthcare Power of Attorney in Chart? No - copy requested No - copy requested    Current Medications (verified) Outpatient Encounter Medications as of 11/14/2023  Medication Sig   Ascorbic Acid (VITAMIN C) 500 MG CAPS Take 500 mg by mouth daily.   Cholecalciferol (VITAMIN D3) 125 MCG (5000 UT) CAPS Take 1 capsule by mouth daily.   GARLIC PO Take 1 tablet by mouth  daily.   HUMIRA PEN 40 MG/0.8ML PNKT Inject 40 mg into the skin every 14 (fourteen) days.   hydrochlorothiazide  (HYDRODIURIL ) 25 MG tablet TAKE 1 TABLET (25 MG TOTAL) BY MOUTH DAILY.   magnesium 30 MG tablet Take 30 mg by mouth 2 (two) times daily.   Omega-3 1000 MG CAPS Take by mouth.   atorvastatin  (LIPITOR) 20 MG tablet Take 1 tablet (20 mg total) by mouth 3 (three) times a week. (Patient not taking: Reported on 11/14/2023)   b complex vitamins capsule Take 1 capsule by mouth daily. (Patient not taking: Reported on 11/14/2023)   Evening Primrose Oil 500 MG CAPS Take 500 mg by mouth daily. (Patient not taking: Reported on 11/14/2023)   vitamin E 180 MG (400 UNITS) capsule Take 400 Units by mouth daily. (Patient not taking: Reported on 11/14/2023)   No facility-administered encounter medications on file as of 11/14/2023.    Allergies (verified) Patient has no known allergies.   History: Past Medical History:  Diagnosis Date   ankylosis spondylitis 01/28/2007   Qualifier: Diagnosis of  By: Waylan DO, Karen     Palpitations 01/29/2006   Qualifier: Diagnosis of  By: Waylan ROSALEA Pao     History reviewed. No pertinent surgical history. Family History  Problem Relation Age of Onset   Rheum arthritis Mother    Congestive Heart Failure Mother    Hypertension Mother    Parkinson's disease Father    Social History   Socioeconomic History   Marital status: Married    Spouse name: Not  on file   Number of children: Not on file   Years of education: Not on file   Highest education level: Master's degree (e.g., MA, MS, MEng, MEd, MSW, MBA)  Occupational History   Not on file  Tobacco Use   Smoking status: Former    Current packs/day: 0.00    Average packs/day: 1 pack/day for 6.9 years (6.9 ttl pk-yrs)    Types: Cigarettes    Start date: 09/28/1975    Quit date: 08/22/1982    Years since quitting: 41.2   Smokeless tobacco: Never  Substance and Sexual Activity   Alcohol use: Not on file    Drug use: Not on file   Sexual activity: Not on file  Other Topics Concern   Not on file  Social History Narrative   Joan Yang lives with her husband. Enjoys gardening.    Social Drivers of Corporate investment banker Strain: Low Risk  (11/14/2023)   Overall Financial Resource Strain (CARDIA)    Difficulty of Paying Living Expenses: Not hard at all  Food Insecurity: No Food Insecurity (11/14/2023)   Hunger Vital Sign    Worried About Running Out of Food in the Last Year: Never true    Ran Out of Food in the Last Year: Never true  Transportation Needs: No Transportation Needs (11/14/2023)   PRAPARE - Administrator, Civil Service (Medical): No    Lack of Transportation (Non-Medical): No  Physical Activity: Insufficiently Active (11/14/2023)   Exercise Vital Sign    Days of Exercise per Week: 2 days    Minutes of Exercise per Session: 30 min  Stress: No Stress Concern Present (11/14/2023)   [employer] of Occupational Health - Occupational Stress Questionnaire    Feeling of Stress: Not at all  Social Connections: Socially Integrated (11/14/2023)   Social Connection and Isolation Panel    Frequency of Communication with Friends and Family: Twice a week    Frequency of Social Gatherings with Friends and Family: Once a week    Attends Religious Services: More than 4 times per year    Active Member of [employer] or Organizations: Yes    Attends Engineer, structural: More than 4 times per year    Marital Status: Married    Tobacco Counseling Counseling given: Not Answered   Clinical Intake:  Pre-visit preparation completed: Yes  Pain : 0-10 Pain Score: 1  Pain Type: Chronic pain Pain Location: Back Pain Orientation: Lower Pain Onset: More than a month ago Pain Frequency: Intermittent     BMI - recorded: 25.75 Nutritional Status: BMI 25 -29 Overweight Diabetes: No  How often do you need to have someone help you when you read instructions, pamphlets, or  other written materials from your doctor or pharmacy?: 1 - Never What is the last grade level you completed in school?: 18  Interpreter Needed?: No      Activities of Daily Living    11/14/2023    1:54 PM  In your present state of health, do you have any difficulty performing the following activities:  Hearing? 1  Vision? 0  Difficulty concentrating or making decisions? 0  Walking or climbing stairs? 0  Dressing or bathing? 0  Doing errands, shopping? 0  Preparing Food and eating ? N  Using the Toilet? N  In the past six months, have you accidently leaked urine? N  Do you have problems with loss of bowel control? N  Managing your Medications? N  Managing your  Finances? N  Housekeeping or managing your Housekeeping? N    Patient Care Team: Alvan Joan BIRCH, MD as PCP - General (Family Medicine) Dr. Tereso Ka (Ophthalmology) Ka Michae LABOR, MD as Referring Physician (Ophthalmology)  Indicate any recent Medical Services you may have received from other than [facility] providers in the past year (date may be approximate).     Assessment:   This is a routine wellness examination for Joan Yang.  Hearing/Vision screen No results found.   Goals Addressed             This Visit's Progress    Patient Stated       Patient states she would like to exercise at least 2 times a week.        Depression Screen    11/14/2023    2:02 PM 12/06/2022   11:18 AM 06/07/2022   10:40 AM 11/30/2021   10:25 AM 03/07/2021   11:18 AM 08/31/2020   11:38 AM 03/03/2020   11:22 AM  PHQ 2/9 Scores  PHQ - 2 Score 0 0 0 0 0 0 0    Fall Risk    11/14/2023    2:03 PM 12/06/2022   11:18 AM 06/07/2022   10:39 AM 11/30/2021   10:25 AM 03/07/2021   11:18 AM  Fall Risk   Falls in the past year? 1 0 0 0 0  Number falls in past yr: 1 0 0 0 0  Injury with Fall? 0 0 0 0 0  Risk for fall due to : History of fall(s) No Fall Risks No Fall Risks No Fall Risks No Fall Risks  Follow up Falls  evaluation completed Falls evaluation completed Falls evaluation completed Falls evaluation completed  Falls prevention discussed;Falls evaluation completed      Data saved with a previous flowsheet row definition     MEDICARE RISK AT HOME: Medicare Risk at Home Any stairs in or around the home?: No Home free of loose throw rugs in walkways, pet beds, electrical cords, etc?: Yes Adequate lighting in your home to reduce risk of falls?: Yes Life alert?: No Use of a cane, walker or w/c?: No Grab bars in the bathroom?: No Shower chair or bench in shower?: Yes Elevated toilet seat or a handicapped toilet?: Yes  TIMED UP AND GO:  Was the test performed?  No    Cognitive Function:        11/14/2023    2:04 PM  6CIT Screen  What Year? 0 points  What month? 0 points  What time? 0 points  Count back from 20 0 points  Months in reverse 0 points  Repeat phrase 0 points  Total Score 0 points    Immunizations Immunization History  Administered Date(s) Administered   DTP 07/17/2008   Influenza Whole 03/29/2006, 02/06/2008, 02/21/2009, 03/29/2010   Influenza,inj,Quad PF,6+ Mos 03/30/2014, 01/02/2018, 01/12/2019, 03/03/2020   Td 03/29/2006, 07/17/2008   Tdap 03/02/2019    TDAP status: Up to date  Flu Vaccine status: Declined, Education has been provided regarding the importance of this vaccine but patient still declined. Advised may receive this vaccine at local pharmacy or Health Dept. Aware to provide a copy of the vaccination record if obtained from local pharmacy or Health Dept. Verbalized acceptance and understanding.  Pneumococcal vaccine status: Due, Education has been provided regarding the importance of this vaccine. Advised may receive this vaccine at local pharmacy or Health Dept. Aware to provide a copy of the vaccination record if obtained from local  pharmacy or Health Dept. Verbalized acceptance and understanding.  Covid-19 vaccine status: Declined, Education has been  provided regarding the importance of this vaccine but patient still declined. Advised may receive this vaccine at local pharmacy or Health Dept.or vaccine clinic. Aware to provide a copy of the vaccination record if obtained from local pharmacy or Health Dept. Verbalized acceptance and understanding.  Qualifies for Shingles Vaccine? Yes   Zostavax completed No   Shingrix Completed?: No.    Education has been provided regarding the importance of this vaccine. Patient has been advised to call insurance company to determine out of pocket expense if they have not yet received this vaccine. Advised may also receive vaccine at local pharmacy or Health Dept. Verbalized acceptance and understanding.  Screening Tests Health Maintenance  Topic Date Due   COVID-19 Vaccine (1) Never done   Zoster Vaccines- Shingrix (1 of 2) Never done   Fecal DNA (Cologuard)  10/31/2023   Pneumococcal Vaccine: 50+ Years (1 of 1 - PCV) 12/06/2023 (Originally 12/25/2005)   INFLUENZA VACCINE  11/22/2023   Medicare Annual Wellness (AWV)  11/13/2024   MAMMOGRAM  05/19/2025   DTaP/Tdap/Td (5 - Td or Tdap) 03/01/2029   DEXA SCAN  Completed   Hepatitis C Screening  Completed   Hepatitis B Vaccines  Aged Out   HPV VACCINES  Aged Out   Meningococcal B Vaccine  Aged Out    Health Maintenance  Health Maintenance Due  Topic Date Due   COVID-19 Vaccine (1) Never done   Zoster Vaccines- Shingrix (1 of 2) Never done   Fecal DNA (Cologuard)  10/31/2023    Colorectal cancer screening: Type of screening: Cologuard. Completed 10/30/2020. Repeat every 3 years  Mammogram status: Completed 05/20/2023. Repeat every year  Bone Density status: Ordered 11/14/2023. Pt provided with contact info and advised to call to schedule appt.  Lung Cancer Screening: (Low Dose CT Chest recommended if Age 38-80 years, 20 pack-year currently smoking OR have quit w/in 15years.) does not qualify.   Lung Cancer Screening Referral: n/a  Additional  Screening:  Hepatitis C Screening: does qualify; Completed 12/14/2014  Vision Screening: Recommended annual ophthalmology exams for early detection of glaucoma and other disorders of the eye. Is the patient up to date with their annual eye exam?  Yes  Who is the provider or what is the name of the office in which the patient attends annual eye exams? Dr Jeanne If pt is not established with a provider, would they like to be referred to a provider to establish care? No .   Dental Screening: Recommended annual dental exams for proper oral hygiene   Community Resource Referral / Chronic Care Management: CRR required this visit?  No   CCM required this visit?  No     Plan:     I have personally reviewed and noted the following in the patient's chart:   Medical and social history Use of alcohol, tobacco or illicit drugs  Current medications and supplements including opioid prescriptions. Patient is not currently taking opioid prescriptions. Functional ability and status Nutritional status Physical activity Advanced directives List of other physicians Hospitalizations, surgeries, and ER visits in previous 12 months. None Vitals Screenings to include cognitive, depression, and falls Referrals and appointments  In addition, I have reviewed and discussed with patient certain preventive protocols, quality metrics, and best practice recommendations. A written personalized care plan for preventive services as well as general preventive health recommendations were provided to patient.     Joan Yang,  Joan Yang, CMA   11/14/2023   After Visit Summary: (MyChart) Due to this being a telephonic visit, the after visit summary with patients personalized plan was offered to patient via MyChart   Nurse Notes:   Joan Yang is a 68 y.o. female patient of Joan Byars, MD who had a Medicare Annual Wellness Visit today via telephone. She reports that he is socially active and does  interact with friends/family regularly. She is moderately physically active. She does enjoy gardening.     Order for bone density sent to [facility]. Ordered Cologuard.

## 2023-11-19 ENCOUNTER — Ambulatory Visit: Admitting: Medical-Surgical

## 2023-11-19 NOTE — Progress Notes (Deleted)
        Established patient visit   History of Present Illness   Discussed the use of AI scribe software for clinical note transcription with the patient, who gave verbal consent to proceed.  History of Present Illness            Physical Exam   Physical Exam  Assessment & Plan   Assessment and Plan               Follow up   No follow-ups on file.  __________________________________ Zada FREDRIK Palin, DNP, APRN, FNP-BC Primary Care and Sports Medicine [facility] [place]

## 2023-11-28 ENCOUNTER — Other Ambulatory Visit: Payer: Self-pay | Admitting: Family Medicine

## 2023-11-28 DIAGNOSIS — I10 Essential (primary) hypertension: Secondary | ICD-10-CM

## 2024-01-06 LAB — HM DEXA SCAN

## 2024-01-07 ENCOUNTER — Ambulatory Visit: Payer: Self-pay | Admitting: Family Medicine

## 2024-01-07 LAB — COLOGUARD: COLOGUARD: NEGATIVE

## 2024-01-07 NOTE — Progress Notes (Signed)
 Great news! Your Cologuard test is negative.  Recommend repeat colon cancer screening in 3 years.

## 2024-01-08 ENCOUNTER — Encounter: Payer: Self-pay | Admitting: Family Medicine

## 2024-01-08 ENCOUNTER — Ambulatory Visit: Payer: Self-pay | Admitting: Family Medicine

## 2024-01-08 DIAGNOSIS — M8580 Other specified disorders of bone density and structure, unspecified site: Secondary | ICD-10-CM | POA: Insufficient documentation

## 2024-01-08 NOTE — Progress Notes (Signed)
 HI Joan Yang, your T-score on your bone density test was -1.6 this puts you into the mildly thin category called osteopenia.   The current recommendation for osteopenia (mildly thin bones) treatment includes:   #1 calcium -total of 1200 mg of calcium  daily.  If you eat a very calcium  rich diet you may be able to obtain that without a supplement.  If not, then I recommend calcium  500 mg twice a day.  There are several products over-the-counter such as Caltrate D and Viactiv chews which are great options that contain calcium  and vitamin D . #2 vitamin D -recommend 800 international units daily. #3 exercise-recommend 30 minutes of weightbearing exercise 3 days a week.  Resistance training ,such as doing bands and light weights, can be particularly helpful.

## 2024-01-13 ENCOUNTER — Ambulatory Visit (INDEPENDENT_AMBULATORY_CARE_PROVIDER_SITE_OTHER): Admitting: Family Medicine

## 2024-01-13 ENCOUNTER — Encounter: Payer: Self-pay | Admitting: Family Medicine

## 2024-01-13 VITALS — BP 142/64 | HR 59 | Ht 60.0 in | Wt 158.1 lb

## 2024-01-13 DIAGNOSIS — E782 Mixed hyperlipidemia: Secondary | ICD-10-CM

## 2024-01-13 DIAGNOSIS — M8580 Other specified disorders of bone density and structure, unspecified site: Secondary | ICD-10-CM

## 2024-01-13 DIAGNOSIS — R232 Flushing: Secondary | ICD-10-CM | POA: Diagnosis not present

## 2024-01-13 DIAGNOSIS — I10 Essential (primary) hypertension: Secondary | ICD-10-CM | POA: Diagnosis not present

## 2024-01-13 MED ORDER — VENLAFAXINE HCL ER 37.5 MG PO CP24: 37.5000 mg | ORAL_CAPSULE | Freq: Every day | ORAL | 1 refills | Status: DC

## 2024-01-13 NOTE — Patient Instructions (Signed)
 Retry atorvastatin  with half a tab every other day and see if able to tolerate.

## 2024-01-13 NOTE — Progress Notes (Signed)
 Established Patient Office Visit  Subjective  Patient ID: SELENY ALLBRIGHT, female    DOB: 06-22-1955  Age: 68 y.o. MRN: [id number]  Chief Complaint  Patient presents with   Hypertension    She did not take her BP medication this morning.  Pt reports that she stopped taking the Atorvastatin  20 mg a few months ago because it made her feel lethargic     HPI Discussed the use of AI scribe software for clinical note transcription with the patient, who gave verbal consent to proceed.  History of Present Illness SARHA BARTELT is a 68 year old female who presents with fatigue and hot flashes.  Fatigue - Significant fatigue present - Fatigue associated with prior use of atorvastatin  - Discontinued atorvastatin  due to exacerbation of fatigue and travel - Has not resumed atorvastatin  since discontinuation  Vasomotor symptoms - Hot flashes and night sweats present - Symptoms had previously subsided but have recently returned - Discontinuation of atorvastatin  did not affect frequency or intensity of hot flashes - Previously used hormone replacement therapy with effective symptom control - Currently not using hormone replacement therapy due to concerns about long-term risks - Exploring non-hormonal options for symptom management  Bone health - Currently taking vitamin D  and calcium  supplements for bone health - Recognizes importance of exercise for bone strength but has not been consistent with physical activity - Working on incorporating more physical activity into routine  Laboratory evaluation - Due for blood work including CBC with differential, CMP, and sed rate per her rheumatologist.  - Plans to complete blood work today - Hydrating well in preparation for blood draw due to history of difficult venipuncture     ROS    Objective:     BP (!) 142/64   Pulse (!) 59   Ht 5' (1.524 m)   Wt 158 lb 1.3 oz (71.7 kg)   SpO2 100%   BMI 30.87 kg/m    Physical Exam Vitals and  nursing note reviewed.  Constitutional:      Appearance: Normal appearance.  HENT:     Head: Normocephalic and atraumatic.  Eyes:     Conjunctiva/sclera: Conjunctivae normal.  Cardiovascular:     Rate and Rhythm: Normal rate and regular rhythm.  Pulmonary:     Effort: Pulmonary effort is normal.     Breath sounds: Normal breath sounds.  Skin:    General: Skin is warm and dry.  Neurological:     Mental Status: She is alert.  Psychiatric:        Mood and Affect: Mood normal.      No results found for any visits on 01/13/24.    The 10-year ASCVD risk score (Arnett DK, et al., 2019) is: 11.6%    Assessment & Plan:   Problem List Items Addressed This Visit       Cardiovascular and Mediastinum   HYPERTENSION, BENIGN SYSTEMIC - Primary   Relevant Orders   CBC with Differential/Platelet   CMP14+EGFR   Sedimentation rate   TSH     Musculoskeletal and Integument   Osteopenia     Other   Hyperlipidemia   Relevant Orders   CBC with Differential/Platelet   CMP14+EGFR   Sedimentation rate   TSH   Other Visit Diagnoses       Hot flashes       Relevant Medications   venlafaxine  XR (EFFEXOR  XR) 37.5 MG 24 hr capsule   Other Relevant Orders   CBC with Differential/Platelet  CMP14+EGFR   Sedimentation rate   TSH      Assessment and Plan Assessment & Plan Menopausal symptoms (hot flashes, night sweats) Recurrent hot flashes and night sweats. Hormone replacement therapy discontinued due to risks. Effexor  preferred for efficacy in reducing symptoms and improving sleep. - Initiate Effexor  37.5 mg once daily for six weeks. - Evaluate response after six weeks; consider dose adjustment if necessary. - Consider gabapentin if Effexor  is ineffective.  Mixed hyperlipidemia Atorvastatin  20 mg discontinued due to fatigue. Discussed restarting at lower dose to assess tolerance. - Restart atorvastatin  10 mg every other day. - Monitor for side effects and efficacy. -  Consider alternative statin if not tolerated.  Osteopenia Managed with vitamin D  and calcium . Emphasized resistance training for bone strength. - Continue vitamin D  and calcium  supplementation. - Initiate resistance training exercises.   Return in about 6 months (around 07/12/2024) for Hypertension.    Dorothyann Byars, MD

## 2024-01-14 ENCOUNTER — Encounter: Payer: Self-pay | Admitting: Family Medicine

## 2024-01-14 ENCOUNTER — Ambulatory Visit: Payer: Self-pay | Admitting: Family Medicine

## 2024-01-14 LAB — CMP14+EGFR
ALT: 15 IU/L (ref 0–32)
AST: 25 IU/L (ref 0–40)
Albumin: 4 g/dL (ref 3.9–4.9)
Alkaline Phosphatase: 75 IU/L (ref 49–135)
BUN/Creatinine Ratio: 16 (ref 12–28)
BUN: 16 mg/dL (ref 8–27)
Bilirubin Total: 0.9 mg/dL (ref 0.0–1.2)
CO2: 24 mmol/L (ref 20–29)
Calcium: 9.2 mg/dL (ref 8.7–10.3)
Chloride: 103 mmol/L (ref 96–106)
Creatinine, Ser: 1.02 mg/dL — ABNORMAL HIGH (ref 0.57–1.00)
Globulin, Total: 3.3 g/dL (ref 1.5–4.5)
Glucose: 70 mg/dL (ref 70–99)
Potassium: 4 mmol/L (ref 3.5–5.2)
Sodium: 140 mmol/L (ref 134–144)
Total Protein: 7.3 g/dL (ref 6.0–8.5)
eGFR: 60 mL/min/1.73 (ref >59)

## 2024-01-14 LAB — CBC WITH DIFFERENTIAL/PLATELET
Basophils Absolute: 0 x10E3/uL (ref 0.0–0.2)
Basos: 1 %
EOS (ABSOLUTE): 0.1 x10E3/uL (ref 0.0–0.4)
Eos: 2 %
Hematocrit: 41.9 % (ref 34.0–46.6)
Hemoglobin: 13.4 g/dL (ref 11.1–15.9)
Immature Grans (Abs): 0 x10E3/uL (ref 0.0–0.1)
Immature Granulocytes: 0 %
Lymphocytes Absolute: 3.3 x10E3/uL — ABNORMAL HIGH (ref 0.7–3.1)
Lymphs: 50 %
MCH: 26.5 pg — ABNORMAL LOW (ref 26.6–33.0)
MCHC: 32 g/dL (ref 31.5–35.7)
MCV: 83 fL (ref 79–97)
Monocytes Absolute: 0.8 x10E3/uL (ref 0.1–0.9)
Monocytes: 12 %
Neutrophils Absolute: 2.2 x10E3/uL (ref 1.4–7.0)
Neutrophils: 35 %
Platelets: 208 x10E3/uL (ref 150–450)
RBC: 5.06 x10E6/uL (ref 3.77–5.28)
RDW: 12.2 % (ref 11.7–15.4)
WBC: 6.4 x10E3/uL (ref 3.4–10.8)

## 2024-01-14 LAB — SEDIMENTATION RATE: Sed Rate: 29 mm/h (ref 0–40)

## 2024-01-14 LAB — TSH: TSH: 0.206 u[IU]/mL — ABNORMAL LOW (ref 0.450–4.500)

## 2024-01-14 NOTE — Progress Notes (Signed)
 Hi Luda, blood count overall looks good.  Thyroid  level was 0.2 that is really where you have been for Probably the last 8 years so that is pretty stable for you.  Kidney function was 1.0 usually drift between 0.8 and 1 no.  So this is still again stable liver function looks good inflammatory marker was normal.

## 2024-02-07 ENCOUNTER — Other Ambulatory Visit: Payer: Self-pay | Admitting: Family Medicine

## 2024-02-07 DIAGNOSIS — R232 Flushing: Secondary | ICD-10-CM

## 2024-07-13 ENCOUNTER — Ambulatory Visit: Admitting: Family Medicine

## 2024-11-24 ENCOUNTER — Ambulatory Visit
